# Patient Record
Sex: Female | Born: 1945 | Race: White | Hispanic: No | Marital: Married | Smoking: Never smoker
Health system: Southern US, Community
[De-identification: ages and names within clinical notes are randomized; demographics above are authoritative.]

## PROBLEM LIST (undated history)

## (undated) DIAGNOSIS — I10 Essential (primary) hypertension: Secondary | ICD-10-CM

## (undated) DIAGNOSIS — T7840XA Allergy, unspecified, initial encounter: Secondary | ICD-10-CM

## (undated) DIAGNOSIS — E785 Hyperlipidemia, unspecified: Secondary | ICD-10-CM

## (undated) DIAGNOSIS — M353 Polymyalgia rheumatica: Secondary | ICD-10-CM

## (undated) DIAGNOSIS — J302 Other seasonal allergic rhinitis: Secondary | ICD-10-CM

## (undated) DIAGNOSIS — R42 Dizziness and giddiness: Secondary | ICD-10-CM

## (undated) DIAGNOSIS — M1990 Unspecified osteoarthritis, unspecified site: Secondary | ICD-10-CM

## (undated) DIAGNOSIS — K219 Gastro-esophageal reflux disease without esophagitis: Secondary | ICD-10-CM

## (undated) DIAGNOSIS — M7060 Trochanteric bursitis, unspecified hip: Secondary | ICD-10-CM

## (undated) DIAGNOSIS — E559 Vitamin D deficiency, unspecified: Secondary | ICD-10-CM

## (undated) HISTORY — PX: JOINT REPLACEMENT: SHX530

## (undated) HISTORY — DX: Trochanteric bursitis, unspecified hip: M70.60

## (undated) HISTORY — DX: Other seasonal allergic rhinitis: J30.2

## (undated) HISTORY — DX: Hyperlipidemia, unspecified: E78.5

## (undated) HISTORY — DX: Dizziness and giddiness: R42

## (undated) HISTORY — DX: Polymyalgia rheumatica: M35.3

## (undated) HISTORY — PX: CATARACT EXTRACTION: SUR2

## (undated) HISTORY — PX: EYE SURGERY: SHX253

## (undated) HISTORY — DX: Gastro-esophageal reflux disease without esophagitis: K21.9

## (undated) HISTORY — DX: Unspecified osteoarthritis, unspecified site: M19.90

## (undated) HISTORY — PX: REPLACEMENT TOTAL KNEE BILATERAL: SUR1225

## (undated) HISTORY — DX: Allergy, unspecified, initial encounter: T78.40XA

## (undated) HISTORY — DX: Essential (primary) hypertension: I10

## (undated) HISTORY — DX: Vitamin D deficiency, unspecified: E55.9

---

## 2008-05-05 HISTORY — PX: ESOPHAGOGASTRODUODENOSCOPY: SHX1529

## 2008-05-06 LAB — HM COLONOSCOPY: HM Colonoscopy: NORMAL

## 2010-05-06 LAB — HM DEXA SCAN

## 2011-06-04 DIAGNOSIS — M76899 Other specified enthesopathies of unspecified lower limb, excluding foot: Secondary | ICD-10-CM | POA: Diagnosis not present

## 2011-06-09 DIAGNOSIS — M545 Low back pain: Secondary | ICD-10-CM | POA: Diagnosis not present

## 2011-06-09 DIAGNOSIS — M5137 Other intervertebral disc degeneration, lumbosacral region: Secondary | ICD-10-CM | POA: Diagnosis not present

## 2011-06-09 DIAGNOSIS — F40298 Other specified phobia: Secondary | ICD-10-CM | POA: Diagnosis not present

## 2011-06-09 DIAGNOSIS — K219 Gastro-esophageal reflux disease without esophagitis: Secondary | ICD-10-CM | POA: Diagnosis not present

## 2011-06-09 DIAGNOSIS — M1990 Unspecified osteoarthritis, unspecified site: Secondary | ICD-10-CM | POA: Diagnosis not present

## 2011-06-09 DIAGNOSIS — M48061 Spinal stenosis, lumbar region without neurogenic claudication: Secondary | ICD-10-CM | POA: Diagnosis not present

## 2011-06-09 DIAGNOSIS — Z96659 Presence of unspecified artificial knee joint: Secondary | ICD-10-CM | POA: Diagnosis not present

## 2011-06-23 DIAGNOSIS — M5137 Other intervertebral disc degeneration, lumbosacral region: Secondary | ICD-10-CM | POA: Diagnosis not present

## 2011-06-23 DIAGNOSIS — M48061 Spinal stenosis, lumbar region without neurogenic claudication: Secondary | ICD-10-CM | POA: Diagnosis not present

## 2011-07-02 DIAGNOSIS — K219 Gastro-esophageal reflux disease without esophagitis: Secondary | ICD-10-CM | POA: Diagnosis not present

## 2011-07-02 DIAGNOSIS — Z79899 Other long term (current) drug therapy: Secondary | ICD-10-CM | POA: Diagnosis not present

## 2011-07-02 DIAGNOSIS — Z0000 Encounter for general adult medical examination without abnormal findings: Secondary | ICD-10-CM | POA: Diagnosis not present

## 2011-07-02 DIAGNOSIS — E785 Hyperlipidemia, unspecified: Secondary | ICD-10-CM | POA: Diagnosis not present

## 2011-07-02 DIAGNOSIS — E559 Vitamin D deficiency, unspecified: Secondary | ICD-10-CM | POA: Diagnosis not present

## 2011-07-02 DIAGNOSIS — I10 Essential (primary) hypertension: Secondary | ICD-10-CM | POA: Diagnosis not present

## 2011-07-10 DIAGNOSIS — D2330 Other benign neoplasm of skin of unspecified part of face: Secondary | ICD-10-CM | POA: Diagnosis not present

## 2011-07-10 DIAGNOSIS — L570 Actinic keratosis: Secondary | ICD-10-CM | POA: Diagnosis not present

## 2011-07-10 DIAGNOSIS — D235 Other benign neoplasm of skin of trunk: Secondary | ICD-10-CM | POA: Diagnosis not present

## 2011-07-10 DIAGNOSIS — C44319 Basal cell carcinoma of skin of other parts of face: Secondary | ICD-10-CM | POA: Diagnosis not present

## 2011-07-17 DIAGNOSIS — H353190 Nonexudative age-related macular degeneration, unspecified eye, stage unspecified: Secondary | ICD-10-CM | POA: Diagnosis not present

## 2011-07-17 DIAGNOSIS — H259 Unspecified age-related cataract: Secondary | ICD-10-CM | POA: Diagnosis not present

## 2011-08-11 DIAGNOSIS — M76899 Other specified enthesopathies of unspecified lower limb, excluding foot: Secondary | ICD-10-CM | POA: Diagnosis not present

## 2011-08-11 DIAGNOSIS — M48061 Spinal stenosis, lumbar region without neurogenic claudication: Secondary | ICD-10-CM | POA: Diagnosis not present

## 2011-08-11 DIAGNOSIS — M5137 Other intervertebral disc degeneration, lumbosacral region: Secondary | ICD-10-CM | POA: Diagnosis not present

## 2011-08-22 DIAGNOSIS — Z1231 Encounter for screening mammogram for malignant neoplasm of breast: Secondary | ICD-10-CM | POA: Diagnosis not present

## 2011-10-03 DIAGNOSIS — M76899 Other specified enthesopathies of unspecified lower limb, excluding foot: Secondary | ICD-10-CM | POA: Diagnosis not present

## 2011-12-19 DIAGNOSIS — M76899 Other specified enthesopathies of unspecified lower limb, excluding foot: Secondary | ICD-10-CM | POA: Diagnosis not present

## 2012-01-19 DIAGNOSIS — H259 Unspecified age-related cataract: Secondary | ICD-10-CM | POA: Diagnosis not present

## 2012-01-23 DIAGNOSIS — M76899 Other specified enthesopathies of unspecified lower limb, excluding foot: Secondary | ICD-10-CM | POA: Diagnosis not present

## 2012-02-20 DIAGNOSIS — M25519 Pain in unspecified shoulder: Secondary | ICD-10-CM | POA: Diagnosis not present

## 2012-02-20 DIAGNOSIS — M25569 Pain in unspecified knee: Secondary | ICD-10-CM | POA: Diagnosis not present

## 2012-03-01 DIAGNOSIS — Z23 Encounter for immunization: Secondary | ICD-10-CM | POA: Diagnosis not present

## 2012-04-05 DIAGNOSIS — Z01810 Encounter for preprocedural cardiovascular examination: Secondary | ICD-10-CM | POA: Diagnosis not present

## 2012-04-05 DIAGNOSIS — M25569 Pain in unspecified knee: Secondary | ICD-10-CM | POA: Diagnosis not present

## 2012-04-05 DIAGNOSIS — M76899 Other specified enthesopathies of unspecified lower limb, excluding foot: Secondary | ICD-10-CM | POA: Diagnosis not present

## 2012-04-05 DIAGNOSIS — Z01818 Encounter for other preprocedural examination: Secondary | ICD-10-CM | POA: Diagnosis not present

## 2012-04-05 DIAGNOSIS — M25519 Pain in unspecified shoulder: Secondary | ICD-10-CM | POA: Diagnosis not present

## 2012-04-07 DIAGNOSIS — N390 Urinary tract infection, site not specified: Secondary | ICD-10-CM | POA: Diagnosis not present

## 2012-04-13 DIAGNOSIS — G8918 Other acute postprocedural pain: Secondary | ICD-10-CM | POA: Diagnosis not present

## 2012-04-13 DIAGNOSIS — Z87440 Personal history of urinary (tract) infections: Secondary | ICD-10-CM | POA: Diagnosis not present

## 2012-04-13 DIAGNOSIS — Z880 Allergy status to penicillin: Secondary | ICD-10-CM | POA: Diagnosis not present

## 2012-04-13 DIAGNOSIS — Z96659 Presence of unspecified artificial knee joint: Secondary | ICD-10-CM | POA: Diagnosis not present

## 2012-04-13 DIAGNOSIS — T84059A Periprosthetic osteolysis of unspecified internal prosthetic joint, initial encounter: Secondary | ICD-10-CM | POA: Diagnosis present

## 2012-04-13 DIAGNOSIS — F40298 Other specified phobia: Secondary | ICD-10-CM | POA: Diagnosis present

## 2012-04-13 DIAGNOSIS — T84099A Other mechanical complication of unspecified internal joint prosthesis, initial encounter: Secondary | ICD-10-CM | POA: Diagnosis not present

## 2012-04-13 DIAGNOSIS — M25569 Pain in unspecified knee: Secondary | ICD-10-CM | POA: Diagnosis not present

## 2012-04-13 DIAGNOSIS — T84069A Wear of articular bearing surface of unspecified internal prosthetic joint, initial encounter: Secondary | ICD-10-CM | POA: Diagnosis present

## 2012-04-13 DIAGNOSIS — I10 Essential (primary) hypertension: Secondary | ICD-10-CM | POA: Diagnosis present

## 2012-04-13 DIAGNOSIS — Z885 Allergy status to narcotic agent status: Secondary | ICD-10-CM | POA: Diagnosis not present

## 2012-04-13 DIAGNOSIS — H269 Unspecified cataract: Secondary | ICD-10-CM | POA: Diagnosis present

## 2012-04-13 DIAGNOSIS — K219 Gastro-esophageal reflux disease without esophagitis: Secondary | ICD-10-CM | POA: Diagnosis present

## 2012-04-13 DIAGNOSIS — Z6834 Body mass index (BMI) 34.0-34.9, adult: Secondary | ICD-10-CM | POA: Diagnosis not present

## 2012-04-13 DIAGNOSIS — Z8261 Family history of arthritis: Secondary | ICD-10-CM | POA: Diagnosis not present

## 2012-04-17 DIAGNOSIS — R269 Unspecified abnormalities of gait and mobility: Secondary | ICD-10-CM | POA: Diagnosis not present

## 2012-04-17 DIAGNOSIS — IMO0001 Reserved for inherently not codable concepts without codable children: Secondary | ICD-10-CM | POA: Diagnosis not present

## 2012-04-17 DIAGNOSIS — Z7901 Long term (current) use of anticoagulants: Secondary | ICD-10-CM | POA: Diagnosis not present

## 2012-04-17 DIAGNOSIS — Z471 Aftercare following joint replacement surgery: Secondary | ICD-10-CM | POA: Diagnosis not present

## 2012-04-17 DIAGNOSIS — Z96659 Presence of unspecified artificial knee joint: Secondary | ICD-10-CM | POA: Diagnosis not present

## 2012-04-19 DIAGNOSIS — IMO0001 Reserved for inherently not codable concepts without codable children: Secondary | ICD-10-CM | POA: Diagnosis not present

## 2012-04-19 DIAGNOSIS — R269 Unspecified abnormalities of gait and mobility: Secondary | ICD-10-CM | POA: Diagnosis not present

## 2012-04-19 DIAGNOSIS — Z7901 Long term (current) use of anticoagulants: Secondary | ICD-10-CM | POA: Diagnosis not present

## 2012-04-19 DIAGNOSIS — Z471 Aftercare following joint replacement surgery: Secondary | ICD-10-CM | POA: Diagnosis not present

## 2012-04-19 DIAGNOSIS — Z96659 Presence of unspecified artificial knee joint: Secondary | ICD-10-CM | POA: Diagnosis not present

## 2012-04-21 DIAGNOSIS — R269 Unspecified abnormalities of gait and mobility: Secondary | ICD-10-CM | POA: Diagnosis not present

## 2012-04-21 DIAGNOSIS — Z471 Aftercare following joint replacement surgery: Secondary | ICD-10-CM | POA: Diagnosis not present

## 2012-04-21 DIAGNOSIS — IMO0001 Reserved for inherently not codable concepts without codable children: Secondary | ICD-10-CM | POA: Diagnosis not present

## 2012-04-21 DIAGNOSIS — Z7901 Long term (current) use of anticoagulants: Secondary | ICD-10-CM | POA: Diagnosis not present

## 2012-04-21 DIAGNOSIS — Z96659 Presence of unspecified artificial knee joint: Secondary | ICD-10-CM | POA: Diagnosis not present

## 2012-04-23 DIAGNOSIS — Z471 Aftercare following joint replacement surgery: Secondary | ICD-10-CM | POA: Diagnosis not present

## 2012-04-23 DIAGNOSIS — Z96659 Presence of unspecified artificial knee joint: Secondary | ICD-10-CM | POA: Diagnosis not present

## 2012-04-23 DIAGNOSIS — Z7901 Long term (current) use of anticoagulants: Secondary | ICD-10-CM | POA: Diagnosis not present

## 2012-04-23 DIAGNOSIS — IMO0001 Reserved for inherently not codable concepts without codable children: Secondary | ICD-10-CM | POA: Diagnosis not present

## 2012-04-23 DIAGNOSIS — R269 Unspecified abnormalities of gait and mobility: Secondary | ICD-10-CM | POA: Diagnosis not present

## 2012-04-26 DIAGNOSIS — Z7901 Long term (current) use of anticoagulants: Secondary | ICD-10-CM | POA: Diagnosis not present

## 2012-04-26 DIAGNOSIS — IMO0001 Reserved for inherently not codable concepts without codable children: Secondary | ICD-10-CM | POA: Diagnosis not present

## 2012-04-26 DIAGNOSIS — R269 Unspecified abnormalities of gait and mobility: Secondary | ICD-10-CM | POA: Diagnosis not present

## 2012-04-26 DIAGNOSIS — Z96659 Presence of unspecified artificial knee joint: Secondary | ICD-10-CM | POA: Diagnosis not present

## 2012-04-26 DIAGNOSIS — Z471 Aftercare following joint replacement surgery: Secondary | ICD-10-CM | POA: Diagnosis not present

## 2012-04-29 DIAGNOSIS — Z96659 Presence of unspecified artificial knee joint: Secondary | ICD-10-CM | POA: Diagnosis not present

## 2012-04-29 DIAGNOSIS — IMO0001 Reserved for inherently not codable concepts without codable children: Secondary | ICD-10-CM | POA: Diagnosis not present

## 2012-04-29 DIAGNOSIS — Z7901 Long term (current) use of anticoagulants: Secondary | ICD-10-CM | POA: Diagnosis not present

## 2012-04-29 DIAGNOSIS — R269 Unspecified abnormalities of gait and mobility: Secondary | ICD-10-CM | POA: Diagnosis not present

## 2012-04-29 DIAGNOSIS — Z471 Aftercare following joint replacement surgery: Secondary | ICD-10-CM | POA: Diagnosis not present

## 2012-04-30 DIAGNOSIS — Z96659 Presence of unspecified artificial knee joint: Secondary | ICD-10-CM | POA: Diagnosis not present

## 2012-04-30 DIAGNOSIS — R269 Unspecified abnormalities of gait and mobility: Secondary | ICD-10-CM | POA: Diagnosis not present

## 2012-04-30 DIAGNOSIS — Z471 Aftercare following joint replacement surgery: Secondary | ICD-10-CM | POA: Diagnosis not present

## 2012-04-30 DIAGNOSIS — IMO0001 Reserved for inherently not codable concepts without codable children: Secondary | ICD-10-CM | POA: Diagnosis not present

## 2012-04-30 DIAGNOSIS — Z7901 Long term (current) use of anticoagulants: Secondary | ICD-10-CM | POA: Diagnosis not present

## 2012-05-10 DIAGNOSIS — R609 Edema, unspecified: Secondary | ICD-10-CM | POA: Diagnosis not present

## 2012-05-10 DIAGNOSIS — M6281 Muscle weakness (generalized): Secondary | ICD-10-CM | POA: Diagnosis not present

## 2012-05-10 DIAGNOSIS — M2540 Effusion, unspecified joint: Secondary | ICD-10-CM | POA: Diagnosis not present

## 2012-05-10 DIAGNOSIS — M25569 Pain in unspecified knee: Secondary | ICD-10-CM | POA: Diagnosis not present

## 2012-05-12 DIAGNOSIS — M25569 Pain in unspecified knee: Secondary | ICD-10-CM | POA: Diagnosis not present

## 2012-05-14 DIAGNOSIS — IMO0001 Reserved for inherently not codable concepts without codable children: Secondary | ICD-10-CM | POA: Diagnosis not present

## 2012-05-17 DIAGNOSIS — M25569 Pain in unspecified knee: Secondary | ICD-10-CM | POA: Diagnosis not present

## 2012-05-17 DIAGNOSIS — M2540 Effusion, unspecified joint: Secondary | ICD-10-CM | POA: Diagnosis not present

## 2012-05-17 DIAGNOSIS — R609 Edema, unspecified: Secondary | ICD-10-CM | POA: Diagnosis not present

## 2012-05-17 DIAGNOSIS — M6281 Muscle weakness (generalized): Secondary | ICD-10-CM | POA: Diagnosis not present

## 2012-05-19 DIAGNOSIS — M25569 Pain in unspecified knee: Secondary | ICD-10-CM | POA: Diagnosis not present

## 2012-05-19 DIAGNOSIS — M2540 Effusion, unspecified joint: Secondary | ICD-10-CM | POA: Diagnosis not present

## 2012-05-19 DIAGNOSIS — R609 Edema, unspecified: Secondary | ICD-10-CM | POA: Diagnosis not present

## 2012-05-19 DIAGNOSIS — M6281 Muscle weakness (generalized): Secondary | ICD-10-CM | POA: Diagnosis not present

## 2012-05-24 DIAGNOSIS — M25569 Pain in unspecified knee: Secondary | ICD-10-CM | POA: Diagnosis not present

## 2012-05-24 DIAGNOSIS — M2540 Effusion, unspecified joint: Secondary | ICD-10-CM | POA: Diagnosis not present

## 2012-05-24 DIAGNOSIS — M6281 Muscle weakness (generalized): Secondary | ICD-10-CM | POA: Diagnosis not present

## 2012-05-26 DIAGNOSIS — R609 Edema, unspecified: Secondary | ICD-10-CM | POA: Diagnosis not present

## 2012-05-26 DIAGNOSIS — M2540 Effusion, unspecified joint: Secondary | ICD-10-CM | POA: Diagnosis not present

## 2012-05-26 DIAGNOSIS — M25569 Pain in unspecified knee: Secondary | ICD-10-CM | POA: Diagnosis not present

## 2012-05-31 DIAGNOSIS — R609 Edema, unspecified: Secondary | ICD-10-CM | POA: Diagnosis not present

## 2012-05-31 DIAGNOSIS — M2540 Effusion, unspecified joint: Secondary | ICD-10-CM | POA: Diagnosis not present

## 2012-05-31 DIAGNOSIS — M25569 Pain in unspecified knee: Secondary | ICD-10-CM | POA: Diagnosis not present

## 2012-05-31 DIAGNOSIS — M6281 Muscle weakness (generalized): Secondary | ICD-10-CM | POA: Diagnosis not present

## 2012-06-07 DIAGNOSIS — M6281 Muscle weakness (generalized): Secondary | ICD-10-CM | POA: Diagnosis not present

## 2012-06-07 DIAGNOSIS — M25569 Pain in unspecified knee: Secondary | ICD-10-CM | POA: Diagnosis not present

## 2012-06-07 DIAGNOSIS — M2540 Effusion, unspecified joint: Secondary | ICD-10-CM | POA: Diagnosis not present

## 2012-06-07 DIAGNOSIS — R609 Edema, unspecified: Secondary | ICD-10-CM | POA: Diagnosis not present

## 2012-06-09 DIAGNOSIS — M6281 Muscle weakness (generalized): Secondary | ICD-10-CM | POA: Diagnosis not present

## 2012-06-09 DIAGNOSIS — R609 Edema, unspecified: Secondary | ICD-10-CM | POA: Diagnosis not present

## 2012-06-09 DIAGNOSIS — M25569 Pain in unspecified knee: Secondary | ICD-10-CM | POA: Diagnosis not present

## 2012-06-09 DIAGNOSIS — M2540 Effusion, unspecified joint: Secondary | ICD-10-CM | POA: Diagnosis not present

## 2012-06-11 DIAGNOSIS — M1710 Unilateral primary osteoarthritis, unspecified knee: Secondary | ICD-10-CM | POA: Diagnosis not present

## 2012-06-11 DIAGNOSIS — M76899 Other specified enthesopathies of unspecified lower limb, excluding foot: Secondary | ICD-10-CM | POA: Diagnosis not present

## 2012-06-14 DIAGNOSIS — R609 Edema, unspecified: Secondary | ICD-10-CM | POA: Diagnosis not present

## 2012-06-14 DIAGNOSIS — M6281 Muscle weakness (generalized): Secondary | ICD-10-CM | POA: Diagnosis not present

## 2012-06-14 DIAGNOSIS — M25569 Pain in unspecified knee: Secondary | ICD-10-CM | POA: Diagnosis not present

## 2012-06-14 DIAGNOSIS — M2540 Effusion, unspecified joint: Secondary | ICD-10-CM | POA: Diagnosis not present

## 2012-06-16 DIAGNOSIS — M6281 Muscle weakness (generalized): Secondary | ICD-10-CM | POA: Diagnosis not present

## 2012-06-16 DIAGNOSIS — M2540 Effusion, unspecified joint: Secondary | ICD-10-CM | POA: Diagnosis not present

## 2012-06-16 DIAGNOSIS — M25569 Pain in unspecified knee: Secondary | ICD-10-CM | POA: Diagnosis not present

## 2012-06-21 DIAGNOSIS — M25569 Pain in unspecified knee: Secondary | ICD-10-CM | POA: Diagnosis not present

## 2012-06-21 DIAGNOSIS — R609 Edema, unspecified: Secondary | ICD-10-CM | POA: Diagnosis not present

## 2012-06-21 DIAGNOSIS — M2540 Effusion, unspecified joint: Secondary | ICD-10-CM | POA: Diagnosis not present

## 2012-06-21 DIAGNOSIS — M6281 Muscle weakness (generalized): Secondary | ICD-10-CM | POA: Diagnosis not present

## 2012-06-23 DIAGNOSIS — M25569 Pain in unspecified knee: Secondary | ICD-10-CM | POA: Diagnosis not present

## 2012-06-30 DIAGNOSIS — M6281 Muscle weakness (generalized): Secondary | ICD-10-CM | POA: Diagnosis not present

## 2012-06-30 DIAGNOSIS — M25569 Pain in unspecified knee: Secondary | ICD-10-CM | POA: Diagnosis not present

## 2012-06-30 DIAGNOSIS — R609 Edema, unspecified: Secondary | ICD-10-CM | POA: Diagnosis not present

## 2012-06-30 DIAGNOSIS — M2540 Effusion, unspecified joint: Secondary | ICD-10-CM | POA: Diagnosis not present

## 2012-07-30 DIAGNOSIS — M1710 Unilateral primary osteoarthritis, unspecified knee: Secondary | ICD-10-CM | POA: Diagnosis not present

## 2012-09-03 DIAGNOSIS — M1710 Unilateral primary osteoarthritis, unspecified knee: Secondary | ICD-10-CM | POA: Diagnosis not present

## 2012-09-03 DIAGNOSIS — M1610 Unilateral primary osteoarthritis, unspecified hip: Secondary | ICD-10-CM | POA: Diagnosis not present

## 2012-09-28 DIAGNOSIS — M064 Inflammatory polyarthropathy: Secondary | ICD-10-CM | POA: Diagnosis not present

## 2012-12-10 DIAGNOSIS — L570 Actinic keratosis: Secondary | ICD-10-CM | POA: Diagnosis not present

## 2012-12-10 DIAGNOSIS — D235 Other benign neoplasm of skin of trunk: Secondary | ICD-10-CM | POA: Diagnosis not present

## 2012-12-10 DIAGNOSIS — D485 Neoplasm of uncertain behavior of skin: Secondary | ICD-10-CM | POA: Diagnosis not present

## 2012-12-10 DIAGNOSIS — L820 Inflamed seborrheic keratosis: Secondary | ICD-10-CM | POA: Diagnosis not present

## 2012-12-10 DIAGNOSIS — D0470 Carcinoma in situ of skin of unspecified lower limb, including hip: Secondary | ICD-10-CM | POA: Diagnosis not present

## 2012-12-13 DIAGNOSIS — M76899 Other specified enthesopathies of unspecified lower limb, excluding foot: Secondary | ICD-10-CM | POA: Diagnosis not present

## 2012-12-15 DIAGNOSIS — H259 Unspecified age-related cataract: Secondary | ICD-10-CM | POA: Diagnosis not present

## 2012-12-21 DIAGNOSIS — M899 Disorder of bone, unspecified: Secondary | ICD-10-CM | POA: Diagnosis not present

## 2012-12-21 DIAGNOSIS — I10 Essential (primary) hypertension: Secondary | ICD-10-CM | POA: Diagnosis not present

## 2012-12-21 DIAGNOSIS — K219 Gastro-esophageal reflux disease without esophagitis: Secondary | ICD-10-CM | POA: Diagnosis not present

## 2012-12-21 DIAGNOSIS — Z0000 Encounter for general adult medical examination without abnormal findings: Secondary | ICD-10-CM | POA: Diagnosis not present

## 2012-12-23 DIAGNOSIS — D0470 Carcinoma in situ of skin of unspecified lower limb, including hip: Secondary | ICD-10-CM | POA: Diagnosis not present

## 2013-01-14 DIAGNOSIS — Z1231 Encounter for screening mammogram for malignant neoplasm of breast: Secondary | ICD-10-CM | POA: Diagnosis not present

## 2013-02-04 DIAGNOSIS — M353 Polymyalgia rheumatica: Secondary | ICD-10-CM | POA: Diagnosis not present

## 2013-02-04 DIAGNOSIS — M064 Inflammatory polyarthropathy: Secondary | ICD-10-CM | POA: Diagnosis not present

## 2013-02-09 DIAGNOSIS — M76899 Other specified enthesopathies of unspecified lower limb, excluding foot: Secondary | ICD-10-CM | POA: Diagnosis not present

## 2013-02-11 DIAGNOSIS — R0982 Postnasal drip: Secondary | ICD-10-CM | POA: Diagnosis not present

## 2013-02-11 DIAGNOSIS — J343 Hypertrophy of nasal turbinates: Secondary | ICD-10-CM | POA: Diagnosis not present

## 2013-02-11 DIAGNOSIS — J309 Allergic rhinitis, unspecified: Secondary | ICD-10-CM | POA: Diagnosis not present

## 2013-02-11 DIAGNOSIS — J329 Chronic sinusitis, unspecified: Secondary | ICD-10-CM | POA: Diagnosis not present

## 2013-02-11 DIAGNOSIS — R51 Headache: Secondary | ICD-10-CM | POA: Diagnosis not present

## 2013-02-25 DIAGNOSIS — J309 Allergic rhinitis, unspecified: Secondary | ICD-10-CM | POA: Diagnosis not present

## 2013-03-02 DIAGNOSIS — R42 Dizziness and giddiness: Secondary | ICD-10-CM | POA: Diagnosis not present

## 2013-03-02 DIAGNOSIS — Z23 Encounter for immunization: Secondary | ICD-10-CM | POA: Diagnosis not present

## 2013-03-02 DIAGNOSIS — R110 Nausea: Secondary | ICD-10-CM | POA: Diagnosis not present

## 2013-03-14 DIAGNOSIS — M76899 Other specified enthesopathies of unspecified lower limb, excluding foot: Secondary | ICD-10-CM | POA: Diagnosis not present

## 2013-04-15 DIAGNOSIS — M76899 Other specified enthesopathies of unspecified lower limb, excluding foot: Secondary | ICD-10-CM | POA: Diagnosis not present

## 2013-04-25 DIAGNOSIS — J40 Bronchitis, not specified as acute or chronic: Secondary | ICD-10-CM | POA: Diagnosis not present

## 2013-05-13 DIAGNOSIS — M353 Polymyalgia rheumatica: Secondary | ICD-10-CM | POA: Diagnosis not present

## 2013-05-13 DIAGNOSIS — E559 Vitamin D deficiency, unspecified: Secondary | ICD-10-CM | POA: Diagnosis not present

## 2013-05-16 ENCOUNTER — Ambulatory Visit: Payer: Self-pay | Admitting: Internal Medicine

## 2013-05-16 DIAGNOSIS — R059 Cough, unspecified: Secondary | ICD-10-CM | POA: Diagnosis not present

## 2013-05-16 DIAGNOSIS — R05 Cough: Secondary | ICD-10-CM | POA: Diagnosis not present

## 2013-05-16 DIAGNOSIS — J40 Bronchitis, not specified as acute or chronic: Secondary | ICD-10-CM | POA: Diagnosis not present

## 2013-06-03 DIAGNOSIS — IMO0002 Reserved for concepts with insufficient information to code with codable children: Secondary | ICD-10-CM | POA: Diagnosis not present

## 2013-06-10 DIAGNOSIS — M5137 Other intervertebral disc degeneration, lumbosacral region: Secondary | ICD-10-CM | POA: Diagnosis not present

## 2013-06-10 DIAGNOSIS — IMO0002 Reserved for concepts with insufficient information to code with codable children: Secondary | ICD-10-CM | POA: Diagnosis not present

## 2013-07-21 DIAGNOSIS — M545 Low back pain, unspecified: Secondary | ICD-10-CM | POA: Diagnosis not present

## 2013-07-21 DIAGNOSIS — M48061 Spinal stenosis, lumbar region without neurogenic claudication: Secondary | ICD-10-CM | POA: Diagnosis not present

## 2013-07-21 DIAGNOSIS — IMO0002 Reserved for concepts with insufficient information to code with codable children: Secondary | ICD-10-CM | POA: Diagnosis not present

## 2013-07-21 DIAGNOSIS — M5137 Other intervertebral disc degeneration, lumbosacral region: Secondary | ICD-10-CM | POA: Diagnosis not present

## 2013-08-03 DIAGNOSIS — M48061 Spinal stenosis, lumbar region without neurogenic claudication: Secondary | ICD-10-CM | POA: Diagnosis not present

## 2013-09-02 DIAGNOSIS — M064 Inflammatory polyarthropathy: Secondary | ICD-10-CM | POA: Diagnosis not present

## 2013-09-07 DIAGNOSIS — M76899 Other specified enthesopathies of unspecified lower limb, excluding foot: Secondary | ICD-10-CM | POA: Diagnosis not present

## 2013-11-25 DIAGNOSIS — M76899 Other specified enthesopathies of unspecified lower limb, excluding foot: Secondary | ICD-10-CM | POA: Diagnosis not present

## 2014-01-06 DIAGNOSIS — M353 Polymyalgia rheumatica: Secondary | ICD-10-CM | POA: Diagnosis not present

## 2014-01-11 DIAGNOSIS — M76899 Other specified enthesopathies of unspecified lower limb, excluding foot: Secondary | ICD-10-CM | POA: Diagnosis not present

## 2014-01-23 DIAGNOSIS — H259 Unspecified age-related cataract: Secondary | ICD-10-CM | POA: Diagnosis not present

## 2014-02-08 DIAGNOSIS — M545 Low back pain: Secondary | ICD-10-CM | POA: Diagnosis not present

## 2014-02-08 DIAGNOSIS — M4806 Spinal stenosis, lumbar region: Secondary | ICD-10-CM | POA: Diagnosis not present

## 2014-02-08 DIAGNOSIS — M5136 Other intervertebral disc degeneration, lumbar region: Secondary | ICD-10-CM | POA: Diagnosis not present

## 2014-02-09 DIAGNOSIS — D235 Other benign neoplasm of skin of trunk: Secondary | ICD-10-CM | POA: Diagnosis not present

## 2014-02-09 DIAGNOSIS — L820 Inflamed seborrheic keratosis: Secondary | ICD-10-CM | POA: Diagnosis not present

## 2014-02-09 DIAGNOSIS — L570 Actinic keratosis: Secondary | ICD-10-CM | POA: Diagnosis not present

## 2014-02-17 DIAGNOSIS — M7062 Trochanteric bursitis, left hip: Secondary | ICD-10-CM | POA: Diagnosis not present

## 2014-02-17 DIAGNOSIS — M7061 Trochanteric bursitis, right hip: Secondary | ICD-10-CM | POA: Diagnosis not present

## 2014-02-20 DIAGNOSIS — Z0000 Encounter for general adult medical examination without abnormal findings: Secondary | ICD-10-CM | POA: Diagnosis not present

## 2014-02-20 DIAGNOSIS — E784 Other hyperlipidemia: Secondary | ICD-10-CM | POA: Diagnosis not present

## 2014-02-20 DIAGNOSIS — I10 Essential (primary) hypertension: Secondary | ICD-10-CM | POA: Diagnosis not present

## 2014-02-20 DIAGNOSIS — K210 Gastro-esophageal reflux disease with esophagitis: Secondary | ICD-10-CM | POA: Diagnosis not present

## 2014-02-20 LAB — BASIC METABOLIC PANEL
BUN: 13 mg/dL (ref 4–21)
CREATININE: 0.9 mg/dL (ref 0.5–1.1)

## 2014-02-20 LAB — LIPID PANEL
CHOLESTEROL: 356 mg/dL — AB (ref 0–200)
HDL: 89 mg/dL — AB (ref 35–70)
LDL Cholesterol: 220 mg/dL
TRIGLYCERIDES: 233 mg/dL — AB (ref 40–160)

## 2014-02-20 LAB — TSH: TSH: 1.7 u[IU]/mL (ref 0.41–5.90)

## 2014-02-20 LAB — CBC AND DIFFERENTIAL: HEMOGLOBIN: 14.5 g/dL (ref 12.0–16.0)

## 2014-03-27 DIAGNOSIS — M7062 Trochanteric bursitis, left hip: Secondary | ICD-10-CM | POA: Diagnosis not present

## 2014-03-27 DIAGNOSIS — M7061 Trochanteric bursitis, right hip: Secondary | ICD-10-CM | POA: Diagnosis not present

## 2014-05-10 DIAGNOSIS — M7062 Trochanteric bursitis, left hip: Secondary | ICD-10-CM | POA: Diagnosis not present

## 2014-05-10 DIAGNOSIS — M7061 Trochanteric bursitis, right hip: Secondary | ICD-10-CM | POA: Diagnosis not present

## 2014-06-23 DIAGNOSIS — M7061 Trochanteric bursitis, right hip: Secondary | ICD-10-CM | POA: Diagnosis not present

## 2014-06-23 DIAGNOSIS — M7062 Trochanteric bursitis, left hip: Secondary | ICD-10-CM | POA: Diagnosis not present

## 2014-06-30 DIAGNOSIS — E2839 Other primary ovarian failure: Secondary | ICD-10-CM | POA: Diagnosis not present

## 2014-06-30 DIAGNOSIS — Z1231 Encounter for screening mammogram for malignant neoplasm of breast: Secondary | ICD-10-CM | POA: Diagnosis not present

## 2014-07-04 LAB — HM MAMMOGRAPHY

## 2014-07-06 DIAGNOSIS — J301 Allergic rhinitis due to pollen: Secondary | ICD-10-CM | POA: Diagnosis not present

## 2014-07-06 DIAGNOSIS — H6121 Impacted cerumen, right ear: Secondary | ICD-10-CM | POA: Diagnosis not present

## 2014-07-06 DIAGNOSIS — J343 Hypertrophy of nasal turbinates: Secondary | ICD-10-CM | POA: Diagnosis not present

## 2014-07-06 DIAGNOSIS — H8111 Benign paroxysmal vertigo, right ear: Secondary | ICD-10-CM | POA: Diagnosis not present

## 2014-07-07 DIAGNOSIS — E559 Vitamin D deficiency, unspecified: Secondary | ICD-10-CM | POA: Diagnosis not present

## 2014-07-07 DIAGNOSIS — M353 Polymyalgia rheumatica: Secondary | ICD-10-CM | POA: Diagnosis not present

## 2014-08-04 DIAGNOSIS — M7062 Trochanteric bursitis, left hip: Secondary | ICD-10-CM | POA: Diagnosis not present

## 2014-08-04 DIAGNOSIS — M7061 Trochanteric bursitis, right hip: Secondary | ICD-10-CM | POA: Diagnosis not present

## 2014-08-11 DIAGNOSIS — D235 Other benign neoplasm of skin of trunk: Secondary | ICD-10-CM | POA: Diagnosis not present

## 2014-08-11 DIAGNOSIS — L820 Inflamed seborrheic keratosis: Secondary | ICD-10-CM | POA: Diagnosis not present

## 2014-08-11 DIAGNOSIS — D234 Other benign neoplasm of skin of scalp and neck: Secondary | ICD-10-CM | POA: Diagnosis not present

## 2014-08-11 DIAGNOSIS — L570 Actinic keratosis: Secondary | ICD-10-CM | POA: Diagnosis not present

## 2014-09-22 DIAGNOSIS — M7061 Trochanteric bursitis, right hip: Secondary | ICD-10-CM | POA: Diagnosis not present

## 2014-09-22 DIAGNOSIS — M7062 Trochanteric bursitis, left hip: Secondary | ICD-10-CM | POA: Diagnosis not present

## 2014-10-24 ENCOUNTER — Encounter: Payer: Self-pay | Admitting: *Deleted

## 2014-11-10 DIAGNOSIS — M7061 Trochanteric bursitis, right hip: Secondary | ICD-10-CM | POA: Diagnosis not present

## 2014-11-10 DIAGNOSIS — M7062 Trochanteric bursitis, left hip: Secondary | ICD-10-CM | POA: Diagnosis not present

## 2014-12-22 DIAGNOSIS — M7062 Trochanteric bursitis, left hip: Secondary | ICD-10-CM | POA: Diagnosis not present

## 2014-12-22 DIAGNOSIS — M7061 Trochanteric bursitis, right hip: Secondary | ICD-10-CM | POA: Diagnosis not present

## 2015-01-12 DIAGNOSIS — M064 Inflammatory polyarthropathy: Secondary | ICD-10-CM | POA: Diagnosis not present

## 2015-01-22 ENCOUNTER — Encounter: Payer: Self-pay | Admitting: Internal Medicine

## 2015-01-22 ENCOUNTER — Other Ambulatory Visit: Payer: Self-pay

## 2015-01-22 ENCOUNTER — Ambulatory Visit (INDEPENDENT_AMBULATORY_CARE_PROVIDER_SITE_OTHER): Payer: Medicare Other | Admitting: Internal Medicine

## 2015-01-22 VITALS — BP 124/64 | HR 105 | Temp 98.6°F | Ht 62.0 in | Wt 212.8 lb

## 2015-01-22 DIAGNOSIS — E559 Vitamin D deficiency, unspecified: Secondary | ICD-10-CM | POA: Insufficient documentation

## 2015-01-22 DIAGNOSIS — N12 Tubulo-interstitial nephritis, not specified as acute or chronic: Secondary | ICD-10-CM | POA: Diagnosis not present

## 2015-01-22 DIAGNOSIS — D229 Melanocytic nevi, unspecified: Secondary | ICD-10-CM | POA: Insufficient documentation

## 2015-01-22 DIAGNOSIS — E785 Hyperlipidemia, unspecified: Secondary | ICD-10-CM | POA: Insufficient documentation

## 2015-01-22 DIAGNOSIS — J302 Other seasonal allergic rhinitis: Secondary | ICD-10-CM | POA: Insufficient documentation

## 2015-01-22 DIAGNOSIS — K2100 Gastro-esophageal reflux disease with esophagitis, without bleeding: Secondary | ICD-10-CM | POA: Insufficient documentation

## 2015-01-22 DIAGNOSIS — E782 Mixed hyperlipidemia: Secondary | ICD-10-CM | POA: Insufficient documentation

## 2015-01-22 DIAGNOSIS — I10 Essential (primary) hypertension: Secondary | ICD-10-CM | POA: Insufficient documentation

## 2015-01-22 MED ORDER — ONDANSETRON 4 MG PO TBDP: 4.0000 mg | ORAL_TABLET | Freq: Three times a day (TID) | ORAL | Status: DC | PRN

## 2015-01-22 MED ORDER — CIPROFLOXACIN HCL 250 MG PO TABS: 250.0000 mg | ORAL_TABLET | Freq: Two times a day (BID) | ORAL | Status: DC

## 2015-01-22 NOTE — Progress Notes (Signed)
Date:  01/22/2015   Name:  Barbara West   DOB:  1946/03/26   MRN:  [id number]   Chief Complaint: URI  patient was feeling sick for the past 5 days. Started with a headache and nausea. She's had several days of vomiting without diarrhea. The headache is responded to ibuprofen or Tylenol. She describes low backache as well as urinary frequency and dysuria. Her urine does not look cloudy or bloody. She denies ear pain sore throat sinus pressure. She believes she may have had low-grade fever the first couple days but none since. She's been too weak to come in but today just felt that she wasn't improving.   Review of Systems:  Review of Systems  Constitutional: Positive for fever, diaphoresis and fatigue. Negative for chills.  HENT: Negative for hearing loss, sinus pressure, sore throat and tinnitus.   Respiratory: Negative for cough, chest tightness and shortness of breath.   Cardiovascular: Negative for chest pain, palpitations and leg swelling.  Gastrointestinal: Positive for abdominal pain.  Genitourinary: Positive for dysuria, urgency and frequency. Negative for hematuria and pelvic pain.  Musculoskeletal: Negative for back pain and gait problem.  Neurological: Positive for light-headedness and headaches. Negative for tremors and syncope.  Hematological: Negative for adenopathy.    Patient Active Problem List   Diagnosis Date Noted  . Avitaminosis D 01/22/2015  . Allergic rhinitis, seasonal 01/22/2015  . Hyperlipidemia 01/22/2015  . 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 Essential (primary) hypertension 01/22/2015  . Gastroesophageal reflux disease with esophagitis 01/22/2015    Prior to Admission medications   Medication Sig Start Date End Date Taking? Authorizing Remona Boom  Azelastine-Fluticasone (DYMISTA) 137-50 MCG/ACT SUSP Place into the nose.   Yes Historical Janoah Menna, MD  fexofenadine (ALLEGRA) 180 MG tablet Take by mouth.   Yes Historical Laurabelle Gorczyca, MD  gabapentin  (NEURONTIN) 100 MG capsule Take by mouth.   Yes Historical Damya Comley, MD  hydrochlorothiazide (HYDRODIURIL) 12.5 MG tablet Take by mouth. 02/20/14  Yes Historical Lejon Afzal, MD  Ibuprofen 200 MG CAPS Take by mouth.   Yes Historical Brooklyn Alfredo, MD  lansoprazole (PREVACID) 30 MG capsule Take by mouth. 02/20/14  Yes Historical Lebron Nauert, MD  olopatadine (PATANOL) 0.1 % ophthalmic solution Apply to eye.   Yes Historical Tyree Vandruff, MD  predniSONE (DELTASONE) 5 MG tablet Take by mouth.   Yes Historical Kihanna Kamiya, MD  MULTIPLE VITAMINS-MINERALS ER PO Take by mouth.    Historical Delois Silvester, MD    Allergies  Allergen Reactions  . Celecoxib Anaphylaxis  . Atorvastatin   . Clarithromycin Other (See Comments)  . Losartan Potassium Other (See Comments)  . Sulfa Antibiotics Nausea And Vomiting  . Penicillins Rash    Past Surgical History  Procedure Laterality Date  . Replacement total knee bilateral      Social History  Substance Use Topics  . Smoking status: Never Smoker   . Smokeless tobacco: None  . Alcohol Use: 1.2 oz/week    2 Standard drinks or equivalent per week     Medication list has been reviewed and updated.  Physical Examination:  Physical Exam  Constitutional: She appears well-developed. She has a sickly appearance. No distress.  HENT:  Right Ear: Tympanic membrane normal.  Left Ear: Tympanic membrane normal.  Nose: Nose normal. Right sinus exhibits no maxillary sinus tenderness and no frontal sinus tenderness. Left sinus exhibits no maxillary sinus tenderness and no frontal sinus tenderness.  Mouth/Throat: Uvula is midline and oropharynx is clear and moist. Mucous membranes are not dry.  Neck: Normal range of motion. Neck supple. No muscular tenderness present.  Cardiovascular: Normal rate, regular rhythm and S1 normal.   Pulmonary/Chest: Breath sounds normal.  Abdominal: Soft. She exhibits no distension. There is tenderness in the suprapubic area. There is CVA tenderness.   Lymphadenopathy:    She has no cervical adenopathy.  Neurological: She is alert.  Skin: Skin is warm and dry.  Nursing note and vitals reviewed.   BP 124/64 mmHg  Pulse 105  Temp(Src) 98.6 F (37 C)  Ht 5\' 2"  (1.575 m)  Wt 212 lb 12.8 oz (96.525 kg)  BMI 38.91 kg/m2  SpO2 95%  Assessment and Plan: 1. Pyelonephritis Patient unable to provide a urine specimen so we'll treat empirically Patient encouraged to increase her fluid intake May take Tylenol as needed for headache Call in several days if not improving for further evaluation - ciprofloxacin (CIPRO) 250 MG tablet; Take 1 tablet (250 mg total) by mouth 2 (two) times daily.  Dispense: 20 tablet; Refill: 0 - ondansetron (ZOFRAN-ODT) 4 MG disintegrating tablet; Take 1 tablet (4 mg total) by mouth every 8 (eight) hours as needed for nausea or vomiting.  Dispense: 20 tablet; Refill: 0   Halina Maidens, MD [facility]  01/22/2015

## 2015-02-02 DIAGNOSIS — M7062 Trochanteric bursitis, left hip: Secondary | ICD-10-CM | POA: Diagnosis not present

## 2015-02-02 DIAGNOSIS — M7061 Trochanteric bursitis, right hip: Secondary | ICD-10-CM | POA: Diagnosis not present

## 2015-02-16 DIAGNOSIS — L820 Inflamed seborrheic keratosis: Secondary | ICD-10-CM | POA: Diagnosis not present

## 2015-02-16 DIAGNOSIS — L570 Actinic keratosis: Secondary | ICD-10-CM | POA: Diagnosis not present

## 2015-02-16 DIAGNOSIS — D485 Neoplasm of uncertain behavior of skin: Secondary | ICD-10-CM | POA: Diagnosis not present

## 2015-02-16 DIAGNOSIS — H0260 Xanthelasma of unspecified eye, unspecified eyelid: Secondary | ICD-10-CM | POA: Diagnosis not present

## 2015-02-16 DIAGNOSIS — L738 Other specified follicular disorders: Secondary | ICD-10-CM | POA: Diagnosis not present

## 2015-02-16 DIAGNOSIS — L821 Other seborrheic keratosis: Secondary | ICD-10-CM | POA: Diagnosis not present

## 2015-02-16 DIAGNOSIS — D1801 Hemangioma of skin and subcutaneous tissue: Secondary | ICD-10-CM | POA: Diagnosis not present

## 2015-03-19 DIAGNOSIS — Z23 Encounter for immunization: Secondary | ICD-10-CM | POA: Diagnosis not present

## 2015-03-23 DIAGNOSIS — Z96653 Presence of artificial knee joint, bilateral: Secondary | ICD-10-CM | POA: Diagnosis not present

## 2015-03-23 DIAGNOSIS — M7061 Trochanteric bursitis, right hip: Secondary | ICD-10-CM | POA: Diagnosis not present

## 2015-03-23 DIAGNOSIS — M7062 Trochanteric bursitis, left hip: Secondary | ICD-10-CM | POA: Diagnosis not present

## 2015-04-13 DIAGNOSIS — H52223 Regular astigmatism, bilateral: Secondary | ICD-10-CM | POA: Diagnosis not present

## 2015-04-13 DIAGNOSIS — H524 Presbyopia: Secondary | ICD-10-CM | POA: Diagnosis not present

## 2015-04-13 DIAGNOSIS — H5203 Hypermetropia, bilateral: Secondary | ICD-10-CM | POA: Diagnosis not present

## 2015-04-13 DIAGNOSIS — H25813 Combined forms of age-related cataract, bilateral: Secondary | ICD-10-CM | POA: Diagnosis not present

## 2015-04-14 ENCOUNTER — Encounter: Payer: Self-pay | Admitting: Internal Medicine

## 2015-04-23 ENCOUNTER — Other Ambulatory Visit: Payer: Self-pay | Admitting: Internal Medicine

## 2015-04-23 ENCOUNTER — Ambulatory Visit (INDEPENDENT_AMBULATORY_CARE_PROVIDER_SITE_OTHER): Payer: Medicare Other | Admitting: Internal Medicine

## 2015-04-23 ENCOUNTER — Encounter: Payer: Self-pay | Admitting: Internal Medicine

## 2015-04-23 VITALS — BP 128/80 | HR 76 | Ht 62.0 in | Wt 219.0 lb

## 2015-04-23 DIAGNOSIS — Z0000 Encounter for general adult medical examination without abnormal findings: Secondary | ICD-10-CM | POA: Diagnosis not present

## 2015-04-23 DIAGNOSIS — E785 Hyperlipidemia, unspecified: Secondary | ICD-10-CM

## 2015-04-23 DIAGNOSIS — I10 Essential (primary) hypertension: Secondary | ICD-10-CM

## 2015-04-23 DIAGNOSIS — K2100 Gastro-esophageal reflux disease with esophagitis, without bleeding: Secondary | ICD-10-CM

## 2015-04-23 DIAGNOSIS — D229 Melanocytic nevi, unspecified: Secondary | ICD-10-CM

## 2015-04-23 DIAGNOSIS — E559 Vitamin D deficiency, unspecified: Secondary | ICD-10-CM | POA: Diagnosis not present

## 2015-04-23 DIAGNOSIS — Z23 Encounter for immunization: Secondary | ICD-10-CM | POA: Diagnosis not present

## 2015-04-23 DIAGNOSIS — N952 Postmenopausal atrophic vaginitis: Secondary | ICD-10-CM | POA: Diagnosis not present

## 2015-04-23 DIAGNOSIS — Z1159 Encounter for screening for other viral diseases: Secondary | ICD-10-CM

## 2015-04-23 LAB — POCT URINALYSIS DIPSTICK
BILIRUBIN UA: NEGATIVE
Glucose, UA: NEGATIVE
Ketones, UA: NEGATIVE
Leukocytes, UA: NEGATIVE
Nitrite, UA: NEGATIVE
PH UA: 5
PROTEIN UA: NEGATIVE
RBC UA: NEGATIVE
Spec Grav, UA: <=1.005
Urobilinogen, UA: 0.2

## 2015-04-23 MED ORDER — ESTROGENS, CONJUGATED 0.625 MG/GM VA CREA: 1.0000 | TOPICAL_CREAM | Freq: Every day | VAGINAL | Status: DC

## 2015-04-23 NOTE — Progress Notes (Signed)
Patient: Barbara West, Female    DOB: [birth year], 69 y.o.   MRN: [id number] Visit Date: 04/23/2015  Today's Provider: Halina Maidens, MD   Chief Complaint  Patient presents with  . Medicare Wellness  . Hypertension  . Hyperlipidemia  . Gastroesophageal Reflux   Subjective:    Annual wellness visit Barbara West is a 69 y.o. female who presents today for her Subsequent Annual Wellness Visit. She feels well. She reports exercising very little. She reports she is sleeping fairly she well. She denies breast problems. Mammogram is due in March. She has some discomfort with urination around the urethra.  No frequency or hematuria.  ----------------------------------------------------------- Hypertension This is a chronic problem. The current episode started more than 1 year ago. The problem is controlled. Pertinent negatives include no chest pain, headaches, neck pain or shortness of breath.  Hyperlipidemia This is a chronic problem. The current episode started more than 1 year ago. The problem is controlled. Recent lipid tests were reviewed and are normal. Pertinent negatives include no chest pain or shortness of breath. She is currently on no antihyperlipidemic treatment (side effects to lipitor).  Gastroesophageal Reflux She reports no abdominal pain, no chest pain, no coughing, no dysphagia, no hoarse voice, no nausea or no sore throat. This is a recurrent problem. The current episode started more than 1 year ago. The problem occurs rarely. Pertinent negatives include no fatigue.    Review of Systems  Constitutional: Negative for fever, chills, fatigue and unexpected weight change.  HENT: Negative for hearing loss, hoarse voice, sore throat, tinnitus and trouble swallowing.   Eyes: Negative for visual disturbance.  Respiratory: Negative for cough, chest tightness and shortness of breath.   Cardiovascular: Negative for chest pain.  Gastrointestinal: Negative for dysphagia, nausea,  vomiting, abdominal pain, diarrhea and blood in stool.  Endocrine: Negative for polydipsia and polyuria.  Genitourinary: Positive for dysuria. Negative for hematuria, vaginal bleeding, vaginal discharge and vaginal pain.  Musculoskeletal: Positive for back pain, arthralgias and gait problem. Negative for joint swelling, neck pain and neck stiffness.  Skin:       Very dry skin -   Allergic/Immunologic: Positive for food allergies.  Neurological: Negative for dizziness, tremors, syncope, light-headedness and headaches.  Hematological: Negative for adenopathy. Does not bruise/bleed easily.  Psychiatric/Behavioral: Negative for sleep disturbance, dysphoric mood and decreased concentration.    Social History   Social History  . Marital Status: Married    Spouse Name: N/A  . Number of Children: N/A  . Years of Education: N/A   Occupational History  . Not on file.   Social History Main Topics  . Smoking status: Never Smoker   . Smokeless tobacco: Not on file  . Alcohol Use: 1.2 oz/week    2 Standard drinks or equivalent per week  . Drug Use: No  . Sexual Activity: Not on file   Other Topics Concern  . Not on file   Social History Narrative    Patient Active Problem List   Diagnosis Date Noted  . 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 Avitaminosis D 01/22/2015  . Allergic rhinitis, seasonal 01/22/2015  . Hyperlipidemia 01/22/2015  . 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 Essential (primary) hypertension 01/22/2015  . Gastroesophageal reflux disease with esophagitis 01/22/2015    Past Surgical History  Procedure Laterality Date  . Replacement total knee bilateral      Her family history is not on file.    Previous Medications   AZELASTINE-FLUTICASONE (DYMISTA) 137-50  MCG/ACT SUSP    Place into the nose.   ERGOCALCIFEROL (VITAMIN D2) 50000 UNITS CAPSULE    Take 50,000 Units by mouth once a week.   FEXOFENADINE (ALLEGRA) 180 MG TABLET    Take by mouth.    GABAPENTIN (NEURONTIN) 100 MG CAPSULE    Take by mouth.   HYDROCHLOROTHIAZIDE (HYDRODIURIL) 12.5 MG TABLET    Take by mouth.   IBUPROFEN 200 MG CAPS    Take by mouth.   LANSOPRAZOLE (PREVACID) 30 MG CAPSULE    Take by mouth.   LIDOCAINE (LIDODERM) 5 %       MULTIPLE VITAMINS-MINERALS ER PO    Take by mouth. Reported on 04/23/2015   OLOPATADINE (PATANOL) 0.1 % OPHTHALMIC SOLUTION    Apply to eye.   PREDNISONE (DELTASONE) 5 MG TABLET    Take by mouth.    No care team member to display     Objective:   Vitals: BP 128/80 mmHg  Pulse 76  Ht 5\' 2"  (1.575 m)  Wt 219 lb (99.338 kg)  BMI 40.05 kg/m2  Physical Exam  Constitutional: She is oriented to person, place, and time. She appears well-developed and well-nourished. No distress.  HENT:  Head: Normocephalic and atraumatic.  Right Ear: Tympanic membrane and ear canal normal.  Left Ear: Tympanic membrane and ear canal normal.  Nose: Right sinus exhibits no maxillary sinus tenderness. Left sinus exhibits no maxillary sinus tenderness.  Mouth/Throat: Uvula is midline and oropharynx is clear and moist.  Eyes: Conjunctivae and EOM are normal. Right eye exhibits no discharge. Left eye exhibits no discharge. No scleral icterus.  Neck: Normal range of motion. Carotid bruit is not present. No erythema present. No thyromegaly present.  Cardiovascular: Normal rate, regular rhythm, normal heart sounds and normal pulses.   Pulmonary/Chest: Effort normal. No respiratory distress. She has no wheezes. Right breast exhibits no mass, no nipple discharge, no skin change and no tenderness. Left breast exhibits no mass, no nipple discharge, no skin change and no tenderness.  Abdominal: Soft. Bowel sounds are normal. There is no hepatosplenomegaly. There is no tenderness. There is no CVA tenderness.  Lymphadenopathy:    She has no cervical adenopathy.    She has no axillary adenopathy.  Neurological: She is alert and oriented to person, place, and time. She  has normal strength and normal reflexes. No cranial nerve deficit or sensory deficit.  Skin: Skin is warm, dry and intact. No rash noted.  Psychiatric: She has a normal mood and affect. Her speech is normal and behavior is normal. Thought content normal.  Nursing note and vitals reviewed.   Activities of Daily Living In your present state of health, do you have any difficulty performing the following activities: 01/22/2015  Hearing? N  Vision? N  Difficulty concentrating or making decisions? N  Walking or climbing stairs? N  Dressing or bathing? N  Doing errands, shopping? N    Fall Risk Assessment Fall Risk  01/22/2015  Falls in the past year? No     Depression Screen PHQ 2/9 Scores 01/22/2015  PHQ - 2 Score 0    Cognitive Testing - 6-CIT   Correct? Score   What year is it? yes 0 Yes = 0    No = 4  What month is it? yes 0 Yes = 0    No = 3  Remember:     Pia [address]au, [place], [place]     What time is it? yes 0 Yes =  0    No = 3  Count backwards from 20 to 1 yes 0 Correct = 0    1 error = 2   More than 1 error = 4  Say the months of the year in reverse. yes 0 Correct = 0    1 error = 2   More than 1 error = 4  What address did I ask you to remember? yes 0 Correct = 0  1 error = 2    2 error = 4    3 error = 6    4 error = 8    All wrong = 10       TOTAL SCORE  0/28   Interpretation:  Normal  Normal (0-7) Abnormal (8-28)   Lab Results  Component Value Date   CHOL 356* 02/20/2014   HDL 89* 02/20/2014   LDLCALC 220 02/20/2014   TRIG 233* 02/20/2014      Medicare Annual Wellness Visit Summary:  Reviewed patient's Family Medical History Reviewed and updated list of patient's medical providers Assessment of cognitive impairment was done Assessed patient's functional ability Established a written schedule for health screening [place] Completed and Reviewed  Exercise Activities and Dietary recommendations Goals    . Decrease the  likelihood of falling     Plans to go to [facility] physical therapy/exercise programs to help with balance and endurance       Immunization History  Administered Date(s) Administered  . Influenza-Unspecified 03/06/2015  . Pneumococcal Conjugate-13 02/20/2014    Health Maintenance  Topic Date Due  . Hepatitis C Screening  Sep 15, 1945  . TETANUS/TDAP  08/14/1964  . PNA vac Low Risk Adult (2 of 2 - PPSV23) 02/21/2015  . INFLUENZA VACCINE  12/04/2015  . MAMMOGRAM  07/03/2016  . COLONOSCOPY  05/06/2018  . DEXA SCAN  Completed  . ZOSTAVAX  Addressed     Discussed health benefits of physical activity, and encouraged her to engage in regular exercise appropriate for her age and condition.    ------------------------------------------------------------------------------------------------------------   Assessment & Plan:     1. Medicare annual wellness visit, subsequent Measures satisfied Second Pneumvax given today TDap next visit Schedule mammogram at [facility] - POCT urinalysis dipstick  2. Essential (primary) hypertension Controlled; may want to stop diuretic and begin ACE  3. Hyperlipidemia On no medications; allergic to shellfish so can't take fish oil Consider trial of statin if lipids higher  4. Gastroesophageal reflux disease with esophagitis controlled  5. Avitaminosis D Continue supplementation    Halina Maidens, MD [facility]  04/23/2015

## 2015-04-23 NOTE — Patient Instructions (Addendum)
Health Maintenance  Topic Date Due  . Hepatitis C Screening  03-24-1946  . TETANUS/TDAP  08/14/1964  . ZOSTAVAX  08/14/2005  . PNA vac Low Risk Adult (2 of 2 - PPSV23) 02/21/2015  . INFLUENZA VACCINE  12/04/2015  . MAMMOGRAM  07/03/2016  . COLONOSCOPY  05/06/2018  . DEXA SCAN  Completed   Pneumococcal Polysaccharide Vaccine: What You Need to Know 1. Why get vaccinated? Vaccination can protect older adults (and some children and younger adults) from pneumococcal disease. Pneumococcal disease is caused by bacteria that can spread from person to person through close contact. It can cause ear infections, and it can also lead to more serious infections of the:   Lungs (pneumonia),  Blood (bacteremia), and  Covering of the brain and spinal cord (meningitis). Meningitis can cause deafness and brain damage, and it can be fatal. Anyone can get pneumococcal disease, but children under 65 years of age, people with certain medical conditions, adults over 14 years of age, and cigarette smokers are at the highest risk. About 18,000 older adults die each year from pneumococcal disease in the [place]. Treatment of pneumococcal infections with penicillin and other drugs used to be more effective. But some strains of the disease have become resistant to these drugs. This makes prevention of the disease, through vaccination, even more important. 2. Pneumococcal polysaccharide vaccine (PPSV23) Pneumococcal polysaccharide vaccine (PPSV23) protects against 23 types of pneumococcal bacteria. It will not prevent all pneumococcal disease. PPSV23 is recommended for:  All adults 69 years of age and older,  Anyone 2 through 69 years of age with certain long-term health problems,  Anyone 2 through 69 years of age with a weakened immune system,  Adults 69 through 69 years of age who smoke cigarettes or have asthma. Most people need only one dose of PPSV. A second dose is recommended for certain high-risk  groups. People 69 and older should get a dose even if they have gotten one or more doses of the vaccine before they turned 65. Your healthcare provider can give you more information about these recommendations. Most healthy adults develop protection within 2 to 3 weeks of getting the shot. 3. Some people should not get this vaccine  Anyone who has had a life-threatening allergic reaction to PPSV should not get another dose.  Anyone who has a severe allergy to any component of PPSV should not receive it. Tell your provider if you have any severe allergies.  Anyone who is moderately or severely ill when the shot is scheduled may be asked to wait until they recover before getting the vaccine. Someone with a mild illness can usually be vaccinated.  Children less than 61 years of age should not receive this vaccine.  There is no evidence that PPSV is harmful to either a pregnant woman or to her fetus. However, as a precaution, women who need the vaccine should be vaccinated before becoming pregnant, if possible. 4. Risks of a vaccine reaction With any medicine, including vaccines, there is a chance of side effects. These are usually mild and go away on their own, but serious reactions are also possible. About half of people who get PPSV have mild side effects, such as redness or pain where the shot is given, which go away within about two days. Less than 1 out of 100 people develop a fever, muscle aches, or more severe local reactions. Problems that could happen after any vaccine:  People sometimes faint after a medical procedure, including vaccination. Sitting  or lying down for about 15 minutes can help prevent fainting, and injuries caused by a fall. Tell your doctor if you feel dizzy, or have vision changes or ringing in the ears.  Some people get severe pain in the shoulder and have difficulty moving the arm where a shot was given. This happens very rarely.  Any medication can cause a severe  allergic reaction. Such reactions from a vaccine are very rare, estimated at about 1 in a million doses, and would happen within a few minutes to a few hours after the vaccination. As with any medicine, there is a very remote chance of a vaccine causing a serious injury or death. The safety of vaccines is always being monitored. For more information, visit: http://www.aguilar.org/ 5. What if there is a serious reaction? What should I look for? Look for anything that concerns you, such as signs of a severe allergic reaction, very high fever, or unusual behavior.  Signs of a severe allergic reaction can include hives, swelling of the face and throat, difficulty breathing, a fast heartbeat, dizziness, and weakness. These would usually start a few minutes to a few hours after the vaccination. What should I do? If you think it is a severe allergic reaction or other emergency that can't wait, call 9-1-1 or get to the nearest hospital. Otherwise, call your doctor. Afterward, the reaction should be reported to the Vaccine Adverse Event Reporting System (VAERS). Your doctor might file this report, or you can do it yourself through the VAERS web site at www.vaers.SamedayNews.es, or by calling (571)717-2758.  VAERS does not give medical advice. 6. How can I learn more?  Ask your doctor. He or she can give you the vaccine package insert or suggest other sources of information.  Call your local or state health department.  Contact the Centers for Disease Control and Prevention (CDC):  Call 909-013-2630 (1-800-CDC-INFO) or  Visit CDC's website at http://hunter.com/ CDC Pneumococcal Polysaccharide Vaccine VIS (08/26/13)   This information is not intended to replace advice given to you by your health care provider. Make sure you discuss any questions you have with your health care provider.   Document Released: 02/16/2006 Document Revised: 05/12/2014 Document Reviewed: 08/29/2013 Elsevier Interactive  Patient Education [employer].

## 2015-04-24 LAB — COMPREHENSIVE METABOLIC PANEL
ALBUMIN: 4.1 g/dL (ref 3.6–4.8)
ALK PHOS: 95 IU/L (ref 39–117)
ALT: 10 IU/L (ref 0–32)
AST: 13 IU/L (ref 0–40)
Albumin/Globulin Ratio: 1.6 (ref 1.1–2.5)
BUN / CREAT RATIO: 13 (ref 11–26)
BUN: 13 mg/dL (ref 8–27)
Bilirubin Total: 0.4 mg/dL (ref 0.0–1.2)
CO2: 27 mmol/L (ref 18–29)
CREATININE: 0.98 mg/dL (ref 0.57–1.00)
Calcium: 9.6 mg/dL (ref 8.7–10.3)
Chloride: 98 mmol/L (ref 96–106)
GFR calc non Af Amer: 59 mL/min/{1.73_m2} — ABNORMAL LOW (ref >59)
GFR, EST AFRICAN AMERICAN: 68 mL/min/{1.73_m2} (ref >59)
GLUCOSE: 70 mg/dL (ref 65–99)
Globulin, Total: 2.6 g/dL (ref 1.5–4.5)
Potassium: 4.2 mmol/L (ref 3.5–5.2)
Sodium: 141 mmol/L (ref 134–144)
TOTAL PROTEIN: 6.7 g/dL (ref 6.0–8.5)

## 2015-04-24 LAB — CBC WITH DIFFERENTIAL/PLATELET
BASOS: 0 %
Basophils Absolute: 0 10*3/uL (ref 0.0–0.2)
EOS (ABSOLUTE): 0.2 10*3/uL (ref 0.0–0.4)
Eos: 2 %
HEMOGLOBIN: 13.9 g/dL (ref 11.1–15.9)
Hematocrit: 42.8 % (ref 34.0–46.6)
IMMATURE GRANS (ABS): 0 10*3/uL (ref 0.0–0.1)
Immature Granulocytes: 0 %
LYMPHS: 33 %
Lymphocytes Absolute: 3.6 10*3/uL — ABNORMAL HIGH (ref 0.7–3.1)
MCH: 28.8 pg (ref 26.6–33.0)
MCHC: 32.5 g/dL (ref 31.5–35.7)
MCV: 89 fL (ref 79–97)
MONOCYTES: 13 %
Monocytes Absolute: 1.4 10*3/uL — ABNORMAL HIGH (ref 0.1–0.9)
NEUTROS ABS: 5.7 10*3/uL (ref 1.4–7.0)
Neutrophils: 52 %
Platelets: 332 10*3/uL (ref 150–379)
RBC: 4.82 x10E6/uL (ref 3.77–5.28)
RDW: 14.4 % (ref 12.3–15.4)
WBC: 11 10*3/uL — ABNORMAL HIGH (ref 3.4–10.8)

## 2015-04-24 LAB — LIPID PANEL
CHOLESTEROL TOTAL: 333 mg/dL — AB (ref 100–199)
Chol/HDL Ratio: 4.2 ratio units (ref 0.0–4.4)
HDL: 79 mg/dL (ref >39)
LDL CALC: 203 mg/dL — AB (ref 0–99)
Triglycerides: 257 mg/dL — ABNORMAL HIGH (ref 0–149)
VLDL CHOLESTEROL CAL: 51 mg/dL — AB (ref 5–40)

## 2015-04-24 LAB — TSH: TSH: 2.38 u[IU]/mL (ref 0.450–4.500)

## 2015-04-24 LAB — HEPATITIS C ANTIBODY: Hep C Virus Ab: <0.1 s/co ratio (ref 0.0–0.9)

## 2015-05-04 DIAGNOSIS — M545 Low back pain: Secondary | ICD-10-CM | POA: Diagnosis not present

## 2015-05-11 DIAGNOSIS — M545 Low back pain: Secondary | ICD-10-CM | POA: Diagnosis not present

## 2015-05-24 DIAGNOSIS — M545 Low back pain: Secondary | ICD-10-CM | POA: Diagnosis not present

## 2015-05-24 DIAGNOSIS — M47816 Spondylosis without myelopathy or radiculopathy, lumbar region: Secondary | ICD-10-CM | POA: Diagnosis not present

## 2015-05-28 DIAGNOSIS — H52223 Regular astigmatism, bilateral: Secondary | ICD-10-CM | POA: Diagnosis not present

## 2015-05-28 DIAGNOSIS — H524 Presbyopia: Secondary | ICD-10-CM | POA: Diagnosis not present

## 2015-05-28 DIAGNOSIS — H04123 Dry eye syndrome of bilateral lacrimal glands: Secondary | ICD-10-CM | POA: Diagnosis not present

## 2015-05-28 DIAGNOSIS — I10 Essential (primary) hypertension: Secondary | ICD-10-CM | POA: Diagnosis not present

## 2015-05-28 DIAGNOSIS — H5203 Hypermetropia, bilateral: Secondary | ICD-10-CM | POA: Diagnosis not present

## 2015-05-28 DIAGNOSIS — H25813 Combined forms of age-related cataract, bilateral: Secondary | ICD-10-CM | POA: Diagnosis not present

## 2015-06-01 DIAGNOSIS — H61031 Chondritis of right external ear: Secondary | ICD-10-CM | POA: Diagnosis not present

## 2015-06-01 DIAGNOSIS — L570 Actinic keratosis: Secondary | ICD-10-CM | POA: Diagnosis not present

## 2015-06-09 ENCOUNTER — Other Ambulatory Visit: Payer: Self-pay | Admitting: Internal Medicine

## 2015-06-15 DIAGNOSIS — M7061 Trochanteric bursitis, right hip: Secondary | ICD-10-CM | POA: Diagnosis not present

## 2015-07-03 DIAGNOSIS — M545 Low back pain: Secondary | ICD-10-CM | POA: Diagnosis not present

## 2015-07-09 DIAGNOSIS — M545 Low back pain: Secondary | ICD-10-CM | POA: Diagnosis not present

## 2015-07-11 ENCOUNTER — Ambulatory Visit (INDEPENDENT_AMBULATORY_CARE_PROVIDER_SITE_OTHER): Payer: Medicare Other | Admitting: Internal Medicine

## 2015-07-11 ENCOUNTER — Encounter: Payer: Self-pay | Admitting: Internal Medicine

## 2015-07-11 VITALS — BP 122/70 | HR 110 | Ht 62.0 in | Wt 213.0 lb

## 2015-07-11 DIAGNOSIS — K2100 Gastro-esophageal reflux disease with esophagitis, without bleeding: Secondary | ICD-10-CM

## 2015-07-11 DIAGNOSIS — I10 Essential (primary) hypertension: Secondary | ICD-10-CM | POA: Diagnosis not present

## 2015-07-11 DIAGNOSIS — M545 Low back pain: Secondary | ICD-10-CM | POA: Diagnosis not present

## 2015-07-11 NOTE — Progress Notes (Signed)
Date:  07/11/2015   Name:  Barbara West   DOB:  1945/11/16   MRN:  [id number]   Chief Complaint: Abdominal Pain Abdominal Pain This is a new problem. The current episode started 1 to 4 weeks ago. The problem occurs 2 to 4 times per day. The problem has been waxing and waning. The pain is located in the epigastric region. The abdominal pain radiates to the epigastric region. Associated symptoms include anorexia and nausea. Pertinent negatives include no constipation, diarrhea, dysuria, fever or vomiting.  Hypertension This is a chronic problem. The current episode started more than 1 year ago. The problem is unchanged. The problem is controlled. Pertinent negatives include no chest pain or shortness of breath.  She feels okay if she doesn't eat but gets hypoglycemic so has tried to eat.  She gets queezy after eating - no vomiting - that lasts about an hour.  This all started after a stomach virus three weeks ago with vomiting and diarrhea.   Review of Systems  Constitutional: Negative for fever and chills.  Respiratory: Negative for chest tightness, shortness of breath and wheezing.   Cardiovascular: Negative for chest pain.  Gastrointestinal: Positive for nausea, abdominal pain, abdominal distention and anorexia. Negative for vomiting, diarrhea, constipation and blood in stool.  Genitourinary: Negative for dysuria.    Patient Active Problem List   Diagnosis Date Noted  . 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 Avitaminosis D 01/22/2015  . Allergic rhinitis, seasonal 01/22/2015  . Hyperlipidemia 01/22/2015  . 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 Essential (primary) hypertension 01/22/2015  . Gastroesophageal reflux disease with esophagitis 01/22/2015    Prior to Admission medications   Medication Sig Start Date End Date Taking? Authorizing Provider  Azelastine-Fluticasone (DYMISTA) 137-50 MCG/ACT SUSP Place into the nose.    Historical Provider, MD  conjugated estrogens  (PREMARIN) vaginal cream Place 1 Applicatorful vaginally daily. 04/23/15   Glean Hess, MD  ergocalciferol (VITAMIN D2) 50000 UNITS capsule Take 50,000 Units by mouth once a week.    Historical Provider, MD  fexofenadine (ALLEGRA) 180 MG tablet Take by mouth.    Historical Provider, MD  gabapentin (NEURONTIN) 100 MG capsule Take by mouth.    Historical Provider, MD  hydrochlorothiazide (HYDRODIURIL) 12.5 MG tablet TAKE 1 TABLET BY MOUTH EVERY DAY 06/09/15   Glean Hess, MD  Ibuprofen 200 MG CAPS Take by mouth.    Historical Provider, MD  lansoprazole (PREVACID) 30 MG capsule Take by mouth. 02/20/14   Historical Provider, MD  lidocaine (LIDODERM) 5 %  03/30/15   Historical Provider, MD  MULTIPLE VITAMINS-MINERALS ER PO Take by mouth. Reported on 04/23/2015    Historical Provider, MD  olopatadine (PATANOL) 0.1 % ophthalmic solution Apply to eye.    Historical Provider, MD  predniSONE (DELTASONE) 5 MG tablet Take by mouth.    Historical Provider, MD    Allergies  Allergen Reactions  . Celecoxib Anaphylaxis  . Atorvastatin   . Clarithromycin Other (See Comments)  . Codeine Other (See Comments), Nausea Only and Nausea And Vomiting  . Losartan Potassium Other (See Comments)  . Morphine And Related Other (See Comments), Nausea Only and Nausea And Vomiting  . Shellfish Allergy Nausea Only  . Sulfa Antibiotics Nausea And Vomiting  . Mupirocin Rash    Cream/ointment.  . Penicillins Rash    Past Surgical History  Procedure Laterality Date  . Replacement total knee bilateral Bilateral     Social History  Substance Use Topics  .  Smoking status: Never Smoker   . Smokeless tobacco: None  . Alcohol Use: 1.2 oz/week    2 Standard drinks or equivalent per week     Comment: once weekly      Medication list has been reviewed and updated.   Physical Exam  Constitutional: She is oriented to person, place, and time. She appears well-developed. No distress.  HENT:  Head:  Normocephalic and atraumatic.  Cardiovascular: Normal rate, regular rhythm and normal heart sounds.   Pulmonary/Chest: Effort normal and breath sounds normal. No respiratory distress.  Abdominal: Soft. Normal appearance and bowel sounds are normal. There is tenderness in the epigastric area. There is no rigidity, no guarding, no CVA tenderness, no tenderness at McBurney's point and negative Murphy's sign.  Musculoskeletal: Normal range of motion.  Neurological: She is alert and oriented to person, place, and time.  Skin: Skin is warm and dry. No rash noted.  Psychiatric: She has a normal mood and affect. Her behavior is normal. Thought content normal.    BP 122/70 mmHg  Pulse 110  Ht 5\' 2"  (1.575 m)  Wt 213 lb (96.616 kg)  BMI 38.95 kg/m2  Assessment and Plan: 1. Gastroesophageal reflux disease with esophagitis Concern for possible ulcer or gastritis but she is taking daily PPI  May need GI evaluation if H Pylori neg and symptoms do not resolve over the next week - H. pylori antibody, IgG  2. Essential (primary) hypertension controlled   Halina Maidens, MD [facility]  07/11/2015

## 2015-07-13 ENCOUNTER — Encounter: Payer: Self-pay | Admitting: *Deleted

## 2015-07-13 DIAGNOSIS — M064 Inflammatory polyarthropathy: Secondary | ICD-10-CM | POA: Diagnosis not present

## 2015-07-13 LAB — H. PYLORI ANTIBODY, IGG: H PYLORI IGG: 0.9 U/mL — AB (ref 0.0–0.8)

## 2015-07-16 ENCOUNTER — Telehealth: Payer: Self-pay

## 2015-07-16 DIAGNOSIS — M545 Low back pain: Secondary | ICD-10-CM | POA: Diagnosis not present

## 2015-07-16 NOTE — Telephone Encounter (Signed)
Spoke with patient. Patient advised of all results and verbalized understanding. Will call back with any future questions or concerns. MAH  

## 2015-07-16 NOTE — Telephone Encounter (Signed)
-----   Message from Glean Hess, MD sent at 07/13/2015  1:29 PM EST ----- H Pylori test is borderline.  I would not treat at this time.  If symptoms continue, I would refer to GI.

## 2015-07-19 DIAGNOSIS — M545 Low back pain: Secondary | ICD-10-CM | POA: Diagnosis not present

## 2015-07-23 DIAGNOSIS — M545 Low back pain: Secondary | ICD-10-CM | POA: Diagnosis not present

## 2015-07-25 DIAGNOSIS — M7062 Trochanteric bursitis, left hip: Secondary | ICD-10-CM | POA: Diagnosis not present

## 2015-07-25 DIAGNOSIS — M545 Low back pain: Secondary | ICD-10-CM | POA: Diagnosis not present

## 2015-07-25 DIAGNOSIS — M7061 Trochanteric bursitis, right hip: Secondary | ICD-10-CM | POA: Diagnosis not present

## 2015-07-26 DIAGNOSIS — M545 Low back pain: Secondary | ICD-10-CM | POA: Diagnosis not present

## 2015-07-31 DIAGNOSIS — M545 Low back pain: Secondary | ICD-10-CM | POA: Diagnosis not present

## 2015-08-08 DIAGNOSIS — M545 Low back pain: Secondary | ICD-10-CM | POA: Diagnosis not present

## 2015-08-09 DIAGNOSIS — Z85828 Personal history of other malignant neoplasm of skin: Secondary | ICD-10-CM | POA: Diagnosis not present

## 2015-08-09 DIAGNOSIS — L720 Epidermal cyst: Secondary | ICD-10-CM | POA: Diagnosis not present

## 2015-08-09 DIAGNOSIS — L820 Inflamed seborrheic keratosis: Secondary | ICD-10-CM | POA: Diagnosis not present

## 2015-08-09 DIAGNOSIS — H02829 Cysts of unspecified eye, unspecified eyelid: Secondary | ICD-10-CM | POA: Diagnosis not present

## 2015-08-09 DIAGNOSIS — L821 Other seborrheic keratosis: Secondary | ICD-10-CM | POA: Diagnosis not present

## 2015-08-09 DIAGNOSIS — D1801 Hemangioma of skin and subcutaneous tissue: Secondary | ICD-10-CM | POA: Diagnosis not present

## 2015-08-09 DIAGNOSIS — D485 Neoplasm of uncertain behavior of skin: Secondary | ICD-10-CM | POA: Diagnosis not present

## 2015-08-10 DIAGNOSIS — M545 Low back pain: Secondary | ICD-10-CM | POA: Diagnosis not present

## 2015-08-14 DIAGNOSIS — M545 Low back pain: Secondary | ICD-10-CM | POA: Diagnosis not present

## 2015-08-17 DIAGNOSIS — M545 Low back pain: Secondary | ICD-10-CM | POA: Diagnosis not present

## 2015-08-20 DIAGNOSIS — M545 Low back pain: Secondary | ICD-10-CM | POA: Diagnosis not present

## 2015-08-23 DIAGNOSIS — M545 Low back pain: Secondary | ICD-10-CM | POA: Diagnosis not present

## 2015-08-24 DIAGNOSIS — Z1231 Encounter for screening mammogram for malignant neoplasm of breast: Secondary | ICD-10-CM | POA: Diagnosis not present

## 2015-08-27 DIAGNOSIS — M545 Low back pain: Secondary | ICD-10-CM | POA: Diagnosis not present

## 2015-08-30 DIAGNOSIS — M545 Low back pain: Secondary | ICD-10-CM | POA: Diagnosis not present

## 2015-08-31 DIAGNOSIS — M7062 Trochanteric bursitis, left hip: Secondary | ICD-10-CM | POA: Diagnosis not present

## 2015-08-31 DIAGNOSIS — M7061 Trochanteric bursitis, right hip: Secondary | ICD-10-CM | POA: Diagnosis not present

## 2015-10-12 DIAGNOSIS — M7061 Trochanteric bursitis, right hip: Secondary | ICD-10-CM | POA: Diagnosis not present

## 2015-10-22 ENCOUNTER — Encounter: Payer: Self-pay | Admitting: Internal Medicine

## 2015-10-22 ENCOUNTER — Ambulatory Visit (INDEPENDENT_AMBULATORY_CARE_PROVIDER_SITE_OTHER): Payer: Medicare Other | Admitting: Internal Medicine

## 2015-10-22 VITALS — BP 135/84 | HR 74 | Resp 16 | Ht 62.0 in | Wt 206.0 lb

## 2015-10-22 DIAGNOSIS — K2100 Gastro-esophageal reflux disease with esophagitis, without bleeding: Secondary | ICD-10-CM

## 2015-10-22 DIAGNOSIS — I10 Essential (primary) hypertension: Secondary | ICD-10-CM | POA: Diagnosis not present

## 2015-10-22 MED ORDER — LANSOPRAZOLE 30 MG PO CPDR: 30.0000 mg | DELAYED_RELEASE_CAPSULE | Freq: Two times a day (BID) | ORAL | Status: DC

## 2015-10-22 NOTE — Progress Notes (Signed)
Date:  10/22/2015   Name:  Barbara West   DOB:  1946/02/03   MRN:  [id number]   Chief Complaint: Gastroesophageal Reflux and Hypertension Gastroesophageal Reflux She complains of heartburn. She reports no chest pain or no coughing. This is a recurrent problem. The problem occurs rarely. Pertinent negatives include no fatigue. She has tried a PPI for the symptoms.  Hypertension This is a chronic problem. The current episode started more than 1 year ago. The problem is unchanged. The problem is controlled. Pertinent negatives include no chest pain, headaches, palpitations or shortness of breath. Past treatments include diuretics. The current treatment provides significant improvement.    Review of Systems  Constitutional: Negative for fever, chills, fatigue and unexpected weight change (16 lbs with effort).  Eyes: Negative for visual disturbance.  Respiratory: Negative for cough, chest tightness and shortness of breath.   Cardiovascular: Negative for chest pain, palpitations and leg swelling.  Gastrointestinal: Positive for heartburn.  Musculoskeletal: Positive for arthralgias and gait problem.  Neurological: Negative for dizziness, syncope, numbness and headaches.  Hematological: Negative for adenopathy.  Psychiatric/Behavioral: Negative for dysphoric mood.    Patient Active Problem List   Diagnosis Date Noted  . 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 Avitaminosis D 01/22/2015  . Allergic rhinitis, seasonal 01/22/2015  . Hyperlipidemia 01/22/2015  . 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 Essential (primary) hypertension 01/22/2015  . Gastroesophageal reflux disease with esophagitis 01/22/2015    Prior to Admission medications   Medication Sig Start Date End Date Taking? Authorizing Provider  Azelastine-Fluticasone (DYMISTA) 137-50 MCG/ACT SUSP Place into the nose.   Yes Historical Provider, MD  conjugated estrogens (PREMARIN) vaginal cream Place 1 Applicatorful vaginally  daily. 04/23/15  Yes Glean Hess, MD  ergocalciferol (VITAMIN D2) 50000 UNITS capsule Take 50,000 Units by mouth once a week.   Yes Historical Provider, MD  fexofenadine (ALLEGRA) 180 MG tablet Take by mouth.   Yes Historical Provider, MD  gabapentin (NEURONTIN) 100 MG capsule Take by mouth.   Yes Historical Provider, MD  hydrochlorothiazide (HYDRODIURIL) 12.5 MG tablet TAKE 1 TABLET BY MOUTH EVERY DAY 06/09/15  Yes Glean Hess, MD  Ibuprofen 200 MG CAPS Take by mouth.   Yes Historical Provider, MD  lansoprazole (PREVACID) 30 MG capsule Take by mouth. 02/20/14  Yes Historical Provider, MD  lidocaine (LIDODERM) 5 %  03/30/15  Yes Historical Provider, MD  Multiple Vitamins-Minerals (OCUVITE EXTRA PO) Take by mouth.   Yes Historical Provider, MD  olopatadine (PATANOL) 0.1 % ophthalmic solution Apply to eye.   Yes Historical Provider, MD  predniSONE (DELTASONE) 5 MG tablet Take by mouth.   Yes Historical Provider, MD  vitamin E 400 UNIT capsule Take 400 Units by mouth daily.   Yes Historical Provider, MD    Allergies  Allergen Reactions  . Celecoxib Anaphylaxis  . Atorvastatin   . Clarithromycin Other (See Comments)  . Codeine Other (See Comments), Nausea Only and Nausea And Vomiting  . Losartan Potassium Other (See Comments)  . Morphine And Related Other (See Comments), Nausea Only and Nausea And Vomiting  . Shellfish Allergy Nausea Only  . Sulfa Antibiotics Nausea And Vomiting  . Mupirocin Rash    Cream/ointment.  . Penicillins Rash    Past Surgical History  Procedure Laterality Date  . Replacement total knee bilateral Bilateral     Social History  Substance Use Topics  . Smoking status: Never Smoker   . Smokeless tobacco: None  . Alcohol Use: 1.2  oz/week    2 Standard drinks or equivalent per week     Comment: once weekly      Medication list has been reviewed and updated.   Physical Exam  Constitutional: She is oriented to person, place, and time. She appears  well-developed and well-nourished.  Neck: Normal range of motion. Neck supple. No thyromegaly present.  Cardiovascular: Normal rate, regular rhythm and normal heart sounds.   Pulmonary/Chest: Effort normal and breath sounds normal. She has no wheezes. She has no rales.  Musculoskeletal: She exhibits no edema.  Neurological: She is alert and oriented to person, place, and time.  Skin: Skin is warm and dry.  Psychiatric: She has a normal mood and affect. Her behavior is normal. Thought content normal.  Nursing note and vitals reviewed.   BP 135/84 mmHg  Pulse 74  Resp 16  Ht 5\' 2"  (1.575 m)  Wt 206 lb (93.441 kg)  BMI 37.67 kg/m2  SpO2 98%  Assessment and Plan: 1. Essential (primary) hypertension Controlled - continue current therapy  2. Gastroesophageal reflux disease with esophagitis Doing well on PPI daily - lansoprazole (PREVACID) 30 MG capsule; Take 1 capsule (30 mg total) by mouth 2 (two) times daily before a meal.  Dispense: 180 capsule; Refill: [place], MD [facility]  10/22/2015

## 2015-11-23 DIAGNOSIS — M7061 Trochanteric bursitis, right hip: Secondary | ICD-10-CM | POA: Diagnosis not present

## 2015-11-23 DIAGNOSIS — M7062 Trochanteric bursitis, left hip: Secondary | ICD-10-CM | POA: Diagnosis not present

## 2015-12-03 IMAGING — CR DG CHEST 2V
1 series · 3 of 3 positions shown · non-contrast
Comparison: None.

CLINICAL DATA: Cough, history of bronchitis

EXAM:
CHEST  2 VIEW

[Series 1: pa · 0.17mm/px · 3 of 3 slices shown]
[im 1/3]
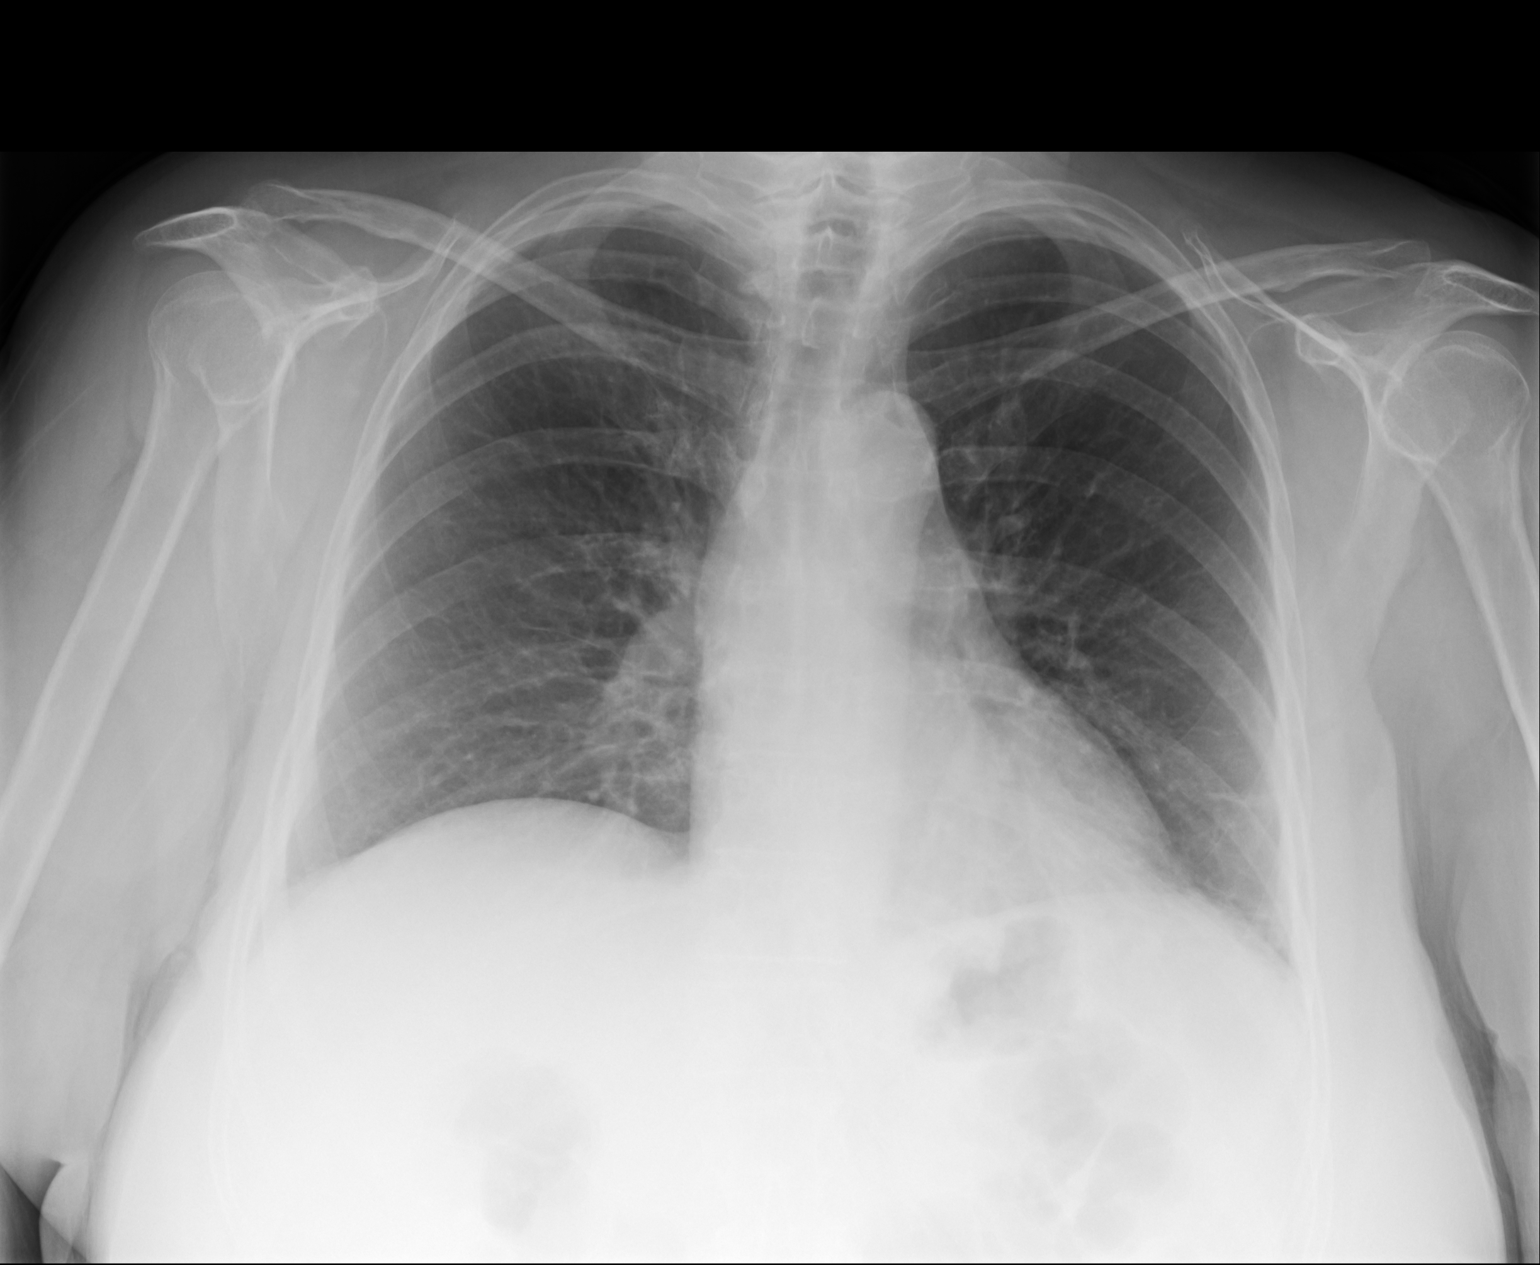
[im 2/3]
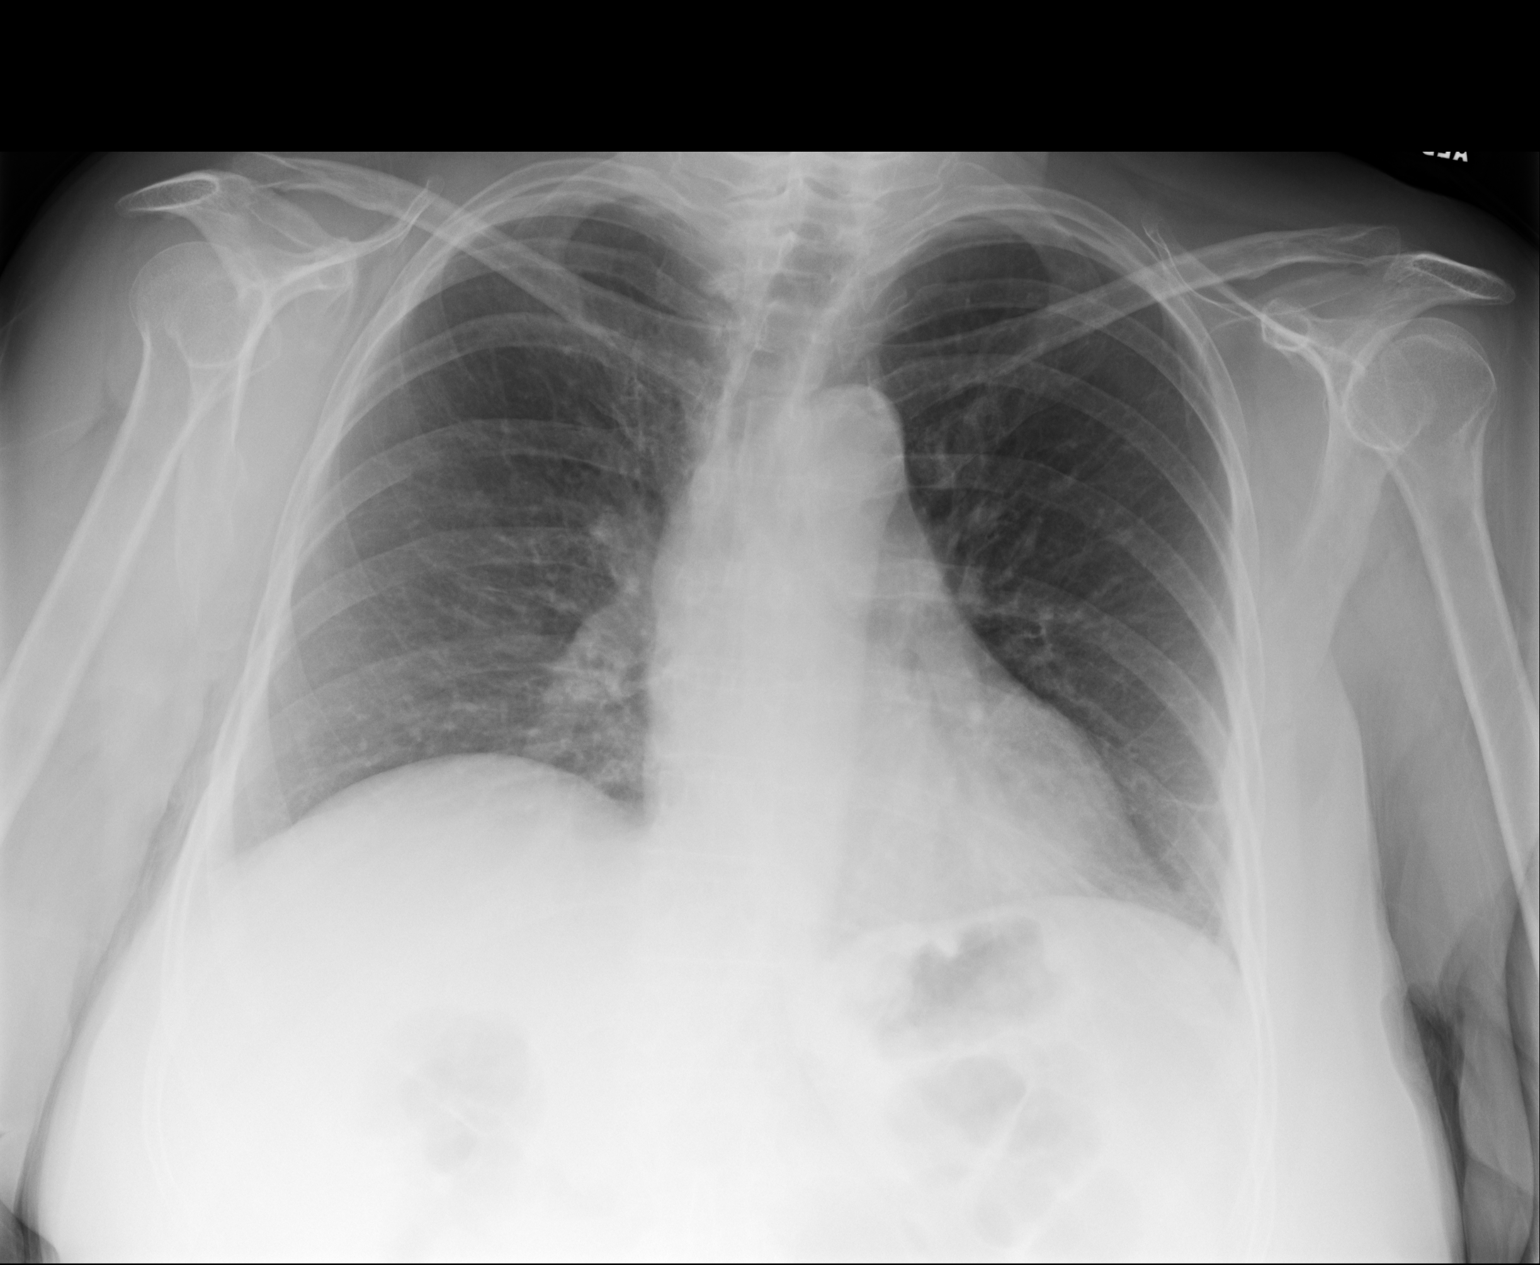
[im 3/3]
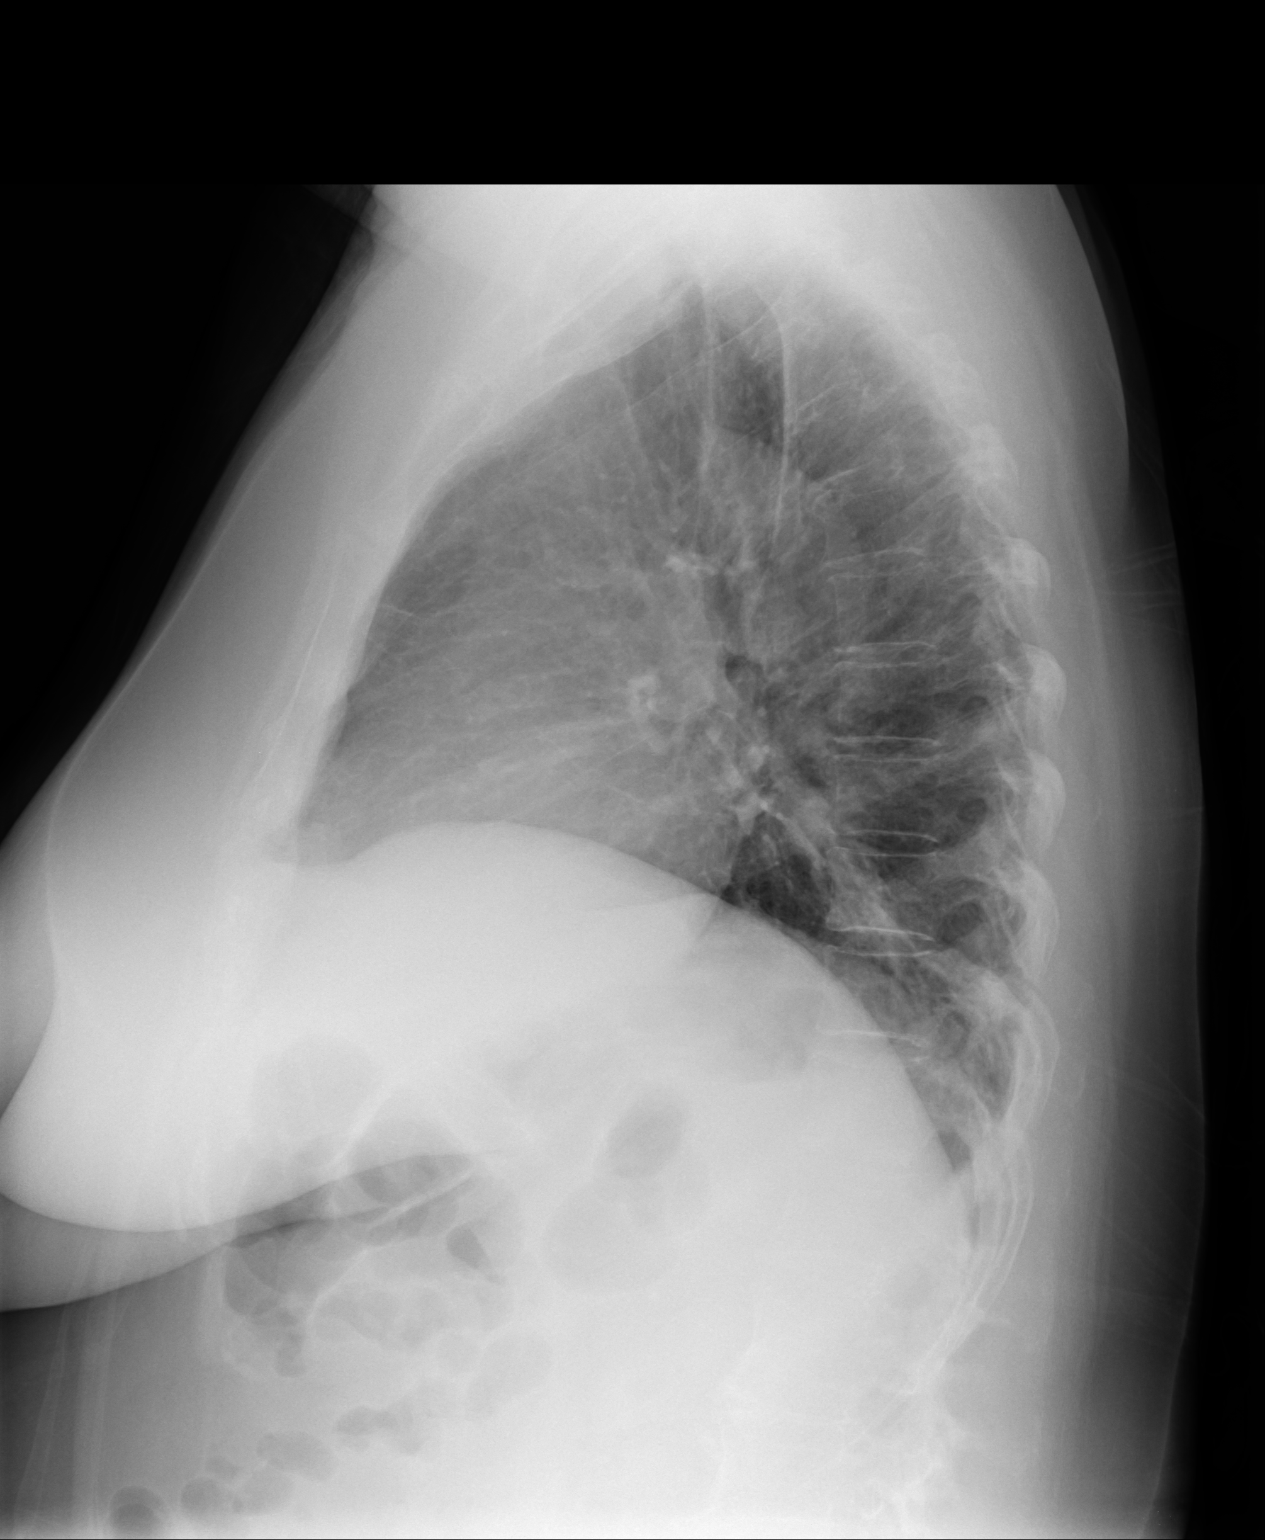

[3 of 3 positions shown; findings below may reference images not displayed]

FINDINGS: Low lung volumes. The heart size and mediastinal contours are within
normal limits. Both lungs are clear. The visualized skeletal
structures are unremarkable.
IMPRESSION: No active cardiopulmonary disease.

## 2016-01-02 DIAGNOSIS — M7062 Trochanteric bursitis, left hip: Secondary | ICD-10-CM | POA: Diagnosis not present

## 2016-01-02 DIAGNOSIS — M7061 Trochanteric bursitis, right hip: Secondary | ICD-10-CM | POA: Diagnosis not present

## 2016-01-31 DIAGNOSIS — M064 Inflammatory polyarthropathy: Secondary | ICD-10-CM | POA: Diagnosis not present

## 2016-01-31 DIAGNOSIS — M353 Polymyalgia rheumatica: Secondary | ICD-10-CM | POA: Diagnosis not present

## 2016-02-06 DIAGNOSIS — Z23 Encounter for immunization: Secondary | ICD-10-CM | POA: Diagnosis not present

## 2016-02-15 DIAGNOSIS — M7061 Trochanteric bursitis, right hip: Secondary | ICD-10-CM | POA: Diagnosis not present

## 2016-02-18 DIAGNOSIS — C44729 Squamous cell carcinoma of skin of left lower limb, including hip: Secondary | ICD-10-CM | POA: Diagnosis not present

## 2016-02-18 DIAGNOSIS — M71341 Other bursal cyst, right hand: Secondary | ICD-10-CM | POA: Diagnosis not present

## 2016-02-18 DIAGNOSIS — L570 Actinic keratosis: Secondary | ICD-10-CM | POA: Diagnosis not present

## 2016-02-18 DIAGNOSIS — Z85828 Personal history of other malignant neoplasm of skin: Secondary | ICD-10-CM | POA: Diagnosis not present

## 2016-02-18 DIAGNOSIS — D485 Neoplasm of uncertain behavior of skin: Secondary | ICD-10-CM | POA: Diagnosis not present

## 2016-02-18 DIAGNOSIS — B078 Other viral warts: Secondary | ICD-10-CM | POA: Diagnosis not present

## 2016-02-18 DIAGNOSIS — L821 Other seborrheic keratosis: Secondary | ICD-10-CM | POA: Diagnosis not present

## 2016-03-03 ENCOUNTER — Ambulatory Visit (INDEPENDENT_AMBULATORY_CARE_PROVIDER_SITE_OTHER): Payer: Medicare Other | Admitting: Internal Medicine

## 2016-03-03 ENCOUNTER — Encounter: Payer: Self-pay | Admitting: Internal Medicine

## 2016-03-03 VITALS — BP 122/82 | HR 78 | Resp 16 | Ht 62.0 in | Wt 206.4 lb

## 2016-03-03 DIAGNOSIS — I10 Essential (primary) hypertension: Secondary | ICD-10-CM | POA: Diagnosis not present

## 2016-03-03 DIAGNOSIS — K2100 Gastro-esophageal reflux disease with esophagitis, without bleeding: Secondary | ICD-10-CM

## 2016-03-03 MED ORDER — RANITIDINE HCL 150 MG PO TABS: 150.0000 mg | ORAL_TABLET | Freq: Two times a day (BID) | ORAL | 1 refills | Status: DC

## 2016-03-03 NOTE — Progress Notes (Signed)
Date:  03/03/2016   Name:  Barbara West   DOB:  Sep 29, 1945   MRN:  [id number]   Chief Complaint: Ulcer (Stomach ulcer after stopping prednisone. Pain right under sternum. No NVD. Stopped prednisone 2 weeks ago and nausea came then acid reflux on prevacid and now feels like a throbbing ache. ) Gastroesophageal Reflux  She complains of abdominal pain and heartburn. She reports no chest pain, no sore throat, no water brash or no wheezing. This is a recurrent problem. The problem occurs frequently. The problem has been unchanged. The heartburn is located in the substernum. The heartburn is of moderate intensity. The symptoms are aggravated by stress. Risk factors include hiatal hernia. She has tried a PPI for the symptoms. The treatment provided no relief (just doubled prevacid 4 days ago). Past procedures include an EGD.  Hypertension  This is a chronic problem. The current episode started more than 1 year ago. The problem is controlled. Pertinent negatives include no chest pain or palpitations.   Tapered quickly off of prednisone that she had been on for 3 years.  Sx started at that time.  Was already on prevacid 30 mg per day.  Also Gaviscon after meals.    Review of Systems  Constitutional: Negative for chills, fever and unexpected weight change.  HENT: Negative for sore throat.   Respiratory: Negative for chest tightness and wheezing.   Cardiovascular: Negative for chest pain, palpitations and leg swelling.  Gastrointestinal: Positive for abdominal pain and heartburn. Negative for blood in stool, constipation, diarrhea and vomiting.    Patient Active Problem List   Diagnosis Date Noted  . 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 Avitaminosis D 01/22/2015  . Allergic rhinitis, seasonal 01/22/2015  . Hyperlipidemia 01/22/2015  . 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 Essential (primary) hypertension 01/22/2015  . Gastroesophageal reflux disease with esophagitis 01/22/2015     Prior to Admission medications   Medication Sig Start Date End Date Taking? Authorizing Provider  Azelastine-Fluticasone (DYMISTA) 137-50 MCG/ACT SUSP Place into the nose.   Yes Historical Provider, MD  conjugated estrogens (PREMARIN) vaginal cream Place 1 Applicatorful vaginally daily. 04/23/15  Yes Glean Hess, MD  fexofenadine (ALLEGRA) 180 MG tablet Take by mouth.   Yes Historical Provider, MD  gabapentin (NEURONTIN) 100 MG capsule Take by mouth.   Yes Historical Provider, MD  hydrochlorothiazide (HYDRODIURIL) 12.5 MG tablet TAKE 1 TABLET BY MOUTH EVERY DAY 06/09/15  Yes Glean Hess, MD  Ibuprofen 200 MG CAPS Take by mouth.   Yes Historical Provider, MD  lansoprazole (PREVACID) 30 MG capsule Take 1 capsule (30 mg total) by mouth 2 (two) times daily before a meal. 10/22/15  Yes Glean Hess, MD  lidocaine (LIDODERM) 5 %  03/30/15  Yes Historical Provider, MD  Multiple Vitamins-Minerals (OCUVITE EXTRA PO) Take by mouth.   Yes Historical Provider, MD  olopatadine (PATANOL) 0.1 % ophthalmic solution Apply to eye.   Yes Historical Provider, MD  vitamin E 400 UNIT capsule Take 400 Units by mouth daily.   Yes Historical Provider, MD    Allergies  Allergen Reactions  . Celecoxib Anaphylaxis  . Atorvastatin   . Clarithromycin Other (See Comments)  . Codeine Other (See Comments), Nausea Only and Nausea And Vomiting  . Losartan Potassium Other (See Comments)  . Morphine And Related Other (See Comments), Nausea Only and Nausea And Vomiting  . Shellfish Allergy Nausea Only  . Sulfa Antibiotics Nausea And Vomiting  . Mupirocin Rash  Cream/ointment.  . Penicillins Rash    Past Surgical History:  Procedure Laterality Date  . REPLACEMENT TOTAL KNEE BILATERAL Bilateral     Social History  Substance Use Topics  . Smoking status: Never Smoker  . Smokeless tobacco: Never Used  . Alcohol use 1.2 oz/week    2 Standard drinks or equivalent per week     Comment: once weekly       Medication list has been reviewed and updated.   Physical Exam  Constitutional: She is oriented to person, place, and time. She appears well-developed. No distress.  HENT:  Head: Normocephalic and atraumatic.  Cardiovascular: Normal rate, regular rhythm and normal heart sounds.   Pulmonary/Chest: Effort normal and breath sounds normal. No respiratory distress.  Abdominal: Soft. Normal appearance and bowel sounds are normal. There is no hepatosplenomegaly. There is tenderness in the epigastric area. There is no rigidity, no guarding and no CVA tenderness.  Musculoskeletal: Normal range of motion.  Neurological: She is alert and oriented to person, place, and time.  Skin: Skin is warm and dry. No rash noted.  Psychiatric: She has a normal mood and affect. Her behavior is normal. Thought content normal.  Nursing note and vitals reviewed.   BP 122/82   Pulse 78   Resp 16   Ht 5\' 2"  (1.575 m)   Wt 206 lb 6.4 oz (93.6 kg)   SpO2 98%   BMI 37.75 kg/m   Assessment and Plan: 1. Gastroesophageal reflux disease with esophagitis Continue bid prevacid Continue PC gaviscon Add ranitidine Call if no improvement in 1-2 weeks - consider UGI series - ranitidine (ZANTAC) 150 MG tablet; Take 1 tablet (150 mg total) by mouth 2 (two) times daily.  Dispense: 60 tablet; Refill: 1  2. Essential (primary) hypertension controlled   Halina Maidens, MD [facility]  03/03/2016

## 2016-03-17 ENCOUNTER — Encounter: Payer: Self-pay | Admitting: Internal Medicine

## 2016-03-17 ENCOUNTER — Ambulatory Visit (INDEPENDENT_AMBULATORY_CARE_PROVIDER_SITE_OTHER): Payer: Medicare Other | Admitting: Internal Medicine

## 2016-03-17 VITALS — BP 120/84 | HR 97 | Temp 98.1°F | Resp 16 | Ht 62.0 in | Wt 204.0 lb

## 2016-03-17 DIAGNOSIS — J40 Bronchitis, not specified as acute or chronic: Secondary | ICD-10-CM | POA: Diagnosis not present

## 2016-03-17 MED ORDER — BENZONATATE 100 MG PO CAPS: 100.0000 mg | ORAL_CAPSULE | Freq: Two times a day (BID) | ORAL | 0 refills | Status: DC | PRN

## 2016-03-17 MED ORDER — ALBUTEROL SULFATE HFA 108 (90 BASE) MCG/ACT IN AERS: 2.0000 | INHALATION_SPRAY | Freq: Four times a day (QID) | RESPIRATORY_TRACT | 0 refills | Status: DC | PRN

## 2016-03-17 MED ORDER — DOXYCYCLINE HYCLATE 100 MG PO TABS: 100.0000 mg | ORAL_TABLET | Freq: Two times a day (BID) | ORAL | 0 refills | Status: DC

## 2016-03-17 NOTE — Progress Notes (Signed)
Date:  03/17/2016   Name:  Barbara West   DOB:  [birth year]   MRN:  [id number]   Chief Complaint: Cough (Started yesterday bodyaches and wakes up with nausea and feeling like there is someone sitting on chest. Has felt cold and chills. ) Cough  This is a new problem. The current episode started yesterday. The problem has been gradually worsening. The problem occurs every few minutes. The cough is non-productive. Associated symptoms include chills. Pertinent negatives include no chest pain, fever, sore throat, shortness of breath or wheezing.     Review of Systems  Constitutional: Positive for chills and fatigue. Negative for fever.  HENT: Negative for congestion, sinus pain, sinus pressure and sore throat.   Respiratory: Positive for cough and chest tightness. Negative for shortness of breath and wheezing.   Cardiovascular: Negative for chest pain, palpitations and leg swelling.  Gastrointestinal: Positive for abdominal pain (gerd much better).  Neurological: Negative for dizziness, tremors and weakness.    Patient Active Problem List   Diagnosis Date Noted  . 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 Avitaminosis D 01/22/2015  . Allergic rhinitis, seasonal 01/22/2015  . Hyperlipidemia 01/22/2015  . 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 Essential (primary) hypertension 01/22/2015  . Gastroesophageal reflux disease with esophagitis 01/22/2015    Prior to Admission medications   Medication Sig Start Date End Date Taking? Authorizing Provider  Azelastine-Fluticasone (DYMISTA) 137-50 MCG/ACT SUSP Place into the nose.   Yes Historical Provider, MD  conjugated estrogens (PREMARIN) vaginal cream Place 1 Applicatorful vaginally daily. 04/23/15  Yes Glean Hess, MD  fexofenadine (ALLEGRA) 180 MG tablet Take by mouth.   Yes Historical Provider, MD  gabapentin (NEURONTIN) 100 MG capsule Take by mouth.   Yes Historical Provider, MD  hydrochlorothiazide (HYDRODIURIL) 12.5 MG tablet  TAKE 1 TABLET BY MOUTH EVERY DAY 06/09/15  Yes Glean Hess, MD  Ibuprofen 200 MG CAPS Take by mouth.   Yes Historical Provider, MD  lansoprazole (PREVACID) 30 MG capsule Take 1 capsule (30 mg total) by mouth 2 (two) times daily before a meal. 10/22/15  Yes Glean Hess, MD  lidocaine (LIDODERM) 5 %  03/30/15  Yes Historical Provider, MD  Multiple Vitamins-Minerals (OCUVITE EXTRA PO) Take by mouth.   Yes Historical Provider, MD  olopatadine (PATANOL) 0.1 % ophthalmic solution Apply to eye.   Yes Historical Provider, MD  ranitidine (ZANTAC) 150 MG tablet Take 1 tablet (150 mg total) by mouth 2 (two) times daily. 03/03/16  Yes Glean Hess, MD  vitamin E 400 UNIT capsule Take 400 Units by mouth daily.   Yes Historical Provider, MD    Allergies  Allergen Reactions  . Celecoxib Anaphylaxis  . Atorvastatin   . Clarithromycin Other (See Comments)  . Codeine Other (See Comments), Nausea Only and Nausea And Vomiting  . Losartan Potassium Other (See Comments)  . Morphine And Related Other (See Comments), Nausea Only and Nausea And Vomiting  . Shellfish Allergy Nausea Only  . Sulfa Antibiotics Nausea And Vomiting  . Mupirocin Rash    Cream/ointment.  . Penicillins Rash    Past Surgical History:  Procedure Laterality Date  . REPLACEMENT TOTAL KNEE BILATERAL Bilateral     Social History  Substance Use Topics  . Smoking status: Never Smoker  . Smokeless tobacco: Never Used  . Alcohol use 1.2 oz/week    2 Standard drinks or equivalent per week     Comment: once weekly  Medication list has been reviewed and updated.   Physical Exam  Constitutional: She is oriented to person, place, and time. She appears well-developed. No distress.  HENT:  Head: Normocephalic and atraumatic.  Cardiovascular: Normal rate, regular rhythm and normal heart sounds.   Pulmonary/Chest: Effort normal. No respiratory distress. She has decreased breath sounds. She has no wheezes.  Bronchial  breath sounds throughout  Musculoskeletal: Normal range of motion.  Neurological: She is alert and oriented to person, place, and time.  Skin: Skin is warm and dry. No rash noted.  Psychiatric: She has a normal mood and affect. Her speech is normal and behavior is normal. Thought content normal.  Nursing note and vitals reviewed.   BP 120/84   Pulse 97   Temp 98.1 F (36.7 C) (Oral)   Resp 16   Ht 5\' 2"  (1.575 m)   Wt 204 lb (92.5 kg)   BMI 37.31 kg/m   Assessment and Plan: 1. Bronchitis Continue tylenol and Mucinex as needed - doxycycline (VIBRA-TABS) 100 MG tablet; Take 1 tablet (100 mg total) by mouth 2 (two) times daily.  Dispense: 20 tablet; Refill: 0 - benzonatate (TESSALON) 100 MG capsule; Take 1 capsule (100 mg total) by mouth 2 (two) times daily as needed for cough.  Dispense: 20 capsule; Refill: 0 - albuterol (PROVENTIL HFA;VENTOLIN HFA) 108 (90 Base) MCG/ACT inhaler; Inhale 2 puffs into the lungs every 6 (six) hours as needed for wheezing or shortness of breath.  Dispense: 1 Inhaler; Refill: 0   Halina Maidens, MD [facility]  03/17/2016

## 2016-03-17 NOTE — Patient Instructions (Signed)
Mucinex-DM twice a day  Tylenol as needed for fever

## 2016-03-19 DIAGNOSIS — C44729 Squamous cell carcinoma of skin of left lower limb, including hip: Secondary | ICD-10-CM | POA: Diagnosis not present

## 2016-03-21 DIAGNOSIS — M7062 Trochanteric bursitis, left hip: Secondary | ICD-10-CM | POA: Diagnosis not present

## 2016-03-21 DIAGNOSIS — M7061 Trochanteric bursitis, right hip: Secondary | ICD-10-CM | POA: Diagnosis not present

## 2016-03-25 ENCOUNTER — Other Ambulatory Visit: Payer: Self-pay | Admitting: Internal Medicine

## 2016-03-25 MED ORDER — CEFUROXIME AXETIL 500 MG PO TABS: 500.0000 mg | ORAL_TABLET | Freq: Two times a day (BID) | ORAL | 0 refills | Status: DC

## 2016-04-14 DIAGNOSIS — H52223 Regular astigmatism, bilateral: Secondary | ICD-10-CM | POA: Diagnosis not present

## 2016-04-14 DIAGNOSIS — H524 Presbyopia: Secondary | ICD-10-CM | POA: Diagnosis not present

## 2016-04-14 DIAGNOSIS — I10 Essential (primary) hypertension: Secondary | ICD-10-CM | POA: Diagnosis not present

## 2016-04-14 DIAGNOSIS — H5203 Hypermetropia, bilateral: Secondary | ICD-10-CM | POA: Diagnosis not present

## 2016-04-14 DIAGNOSIS — H25813 Combined forms of age-related cataract, bilateral: Secondary | ICD-10-CM | POA: Diagnosis not present

## 2016-04-14 DIAGNOSIS — H04123 Dry eye syndrome of bilateral lacrimal glands: Secondary | ICD-10-CM | POA: Diagnosis not present

## 2016-04-14 DIAGNOSIS — H353131 Nonexudative age-related macular degeneration, bilateral, early dry stage: Secondary | ICD-10-CM | POA: Diagnosis not present

## 2016-05-02 DIAGNOSIS — M7061 Trochanteric bursitis, right hip: Secondary | ICD-10-CM | POA: Diagnosis not present

## 2016-05-02 DIAGNOSIS — M7062 Trochanteric bursitis, left hip: Secondary | ICD-10-CM | POA: Diagnosis not present

## 2016-05-08 ENCOUNTER — Encounter: Payer: Self-pay | Admitting: Internal Medicine

## 2016-05-16 DIAGNOSIS — C44729 Squamous cell carcinoma of skin of left lower limb, including hip: Secondary | ICD-10-CM | POA: Diagnosis not present

## 2016-05-16 DIAGNOSIS — D485 Neoplasm of uncertain behavior of skin: Secondary | ICD-10-CM | POA: Diagnosis not present

## 2016-05-16 DIAGNOSIS — C44629 Squamous cell carcinoma of skin of left upper limb, including shoulder: Secondary | ICD-10-CM | POA: Diagnosis not present

## 2016-06-06 DIAGNOSIS — M7061 Trochanteric bursitis, right hip: Secondary | ICD-10-CM | POA: Diagnosis not present

## 2016-06-10 ENCOUNTER — Other Ambulatory Visit: Payer: Self-pay | Admitting: Internal Medicine

## 2016-06-30 ENCOUNTER — Ambulatory Visit (INDEPENDENT_AMBULATORY_CARE_PROVIDER_SITE_OTHER): Payer: Medicare Other | Admitting: Internal Medicine

## 2016-06-30 ENCOUNTER — Encounter: Payer: Self-pay | Admitting: Internal Medicine

## 2016-06-30 VITALS — BP 128/80 | HR 90 | Ht 62.0 in | Wt 204.0 lb

## 2016-06-30 DIAGNOSIS — K2100 Gastro-esophageal reflux disease with esophagitis, without bleeding: Secondary | ICD-10-CM

## 2016-06-30 DIAGNOSIS — N952 Postmenopausal atrophic vaginitis: Secondary | ICD-10-CM | POA: Diagnosis not present

## 2016-06-30 DIAGNOSIS — I10 Essential (primary) hypertension: Secondary | ICD-10-CM

## 2016-06-30 MED ORDER — ESTROGENS, CONJUGATED 0.625 MG/GM VA CREA: 1.0000 | TOPICAL_CREAM | Freq: Every day | VAGINAL | 12 refills | Status: DC

## 2016-06-30 MED ORDER — RANITIDINE HCL 150 MG PO TABS: 150.0000 mg | ORAL_TABLET | Freq: Two times a day (BID) | ORAL | 5 refills | Status: DC

## 2016-06-30 NOTE — Progress Notes (Signed)
Date:  06/30/2016   Name:  Barbara West   DOB:  03-16-1946   MRN:  [id number]   Chief Complaint: Abdominal Pain ("hurts and burns", and has reflux with this. Pt has family hx of hiatal hernia. Pt also Needs premarin cream refilled.) Abdominal Pain  This is a chronic problem. The problem occurs daily. The problem has been waxing and waning. The pain is located in the epigastric region. The pain is mild. She has tried proton pump inhibitors and H2 blockers for the symptoms. Prior diagnostic workup includes upper endoscopy and GI consult.  Hypertension  This is a chronic problem. The current episode started more than 1 year ago. The problem is unchanged. The problem is controlled. Pertinent negatives include no chest pain or shortness of breath.    Review of Systems  Constitutional: Negative for unexpected weight change.  Respiratory: Negative for chest tightness, shortness of breath and wheezing.   Cardiovascular: Negative for chest pain.  Gastrointestinal: Positive for abdominal pain. Negative for blood in stool and rectal pain.  Genitourinary: Positive for vaginal pain.    Patient Active Problem List   Diagnosis Date Noted  . 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 Avitaminosis D 01/22/2015  . Allergic rhinitis, seasonal 01/22/2015  . Hyperlipidemia 01/22/2015  . 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 Essential (primary) hypertension 01/22/2015  . Gastroesophageal reflux disease with esophagitis 01/22/2015    Prior to Admission medications   Medication Sig Start Date End Date Taking? Authorizing Provider  albuterol (PROVENTIL HFA;VENTOLIN HFA) 108 (90 Base) MCG/ACT inhaler Inhale 2 puffs into the lungs every 6 (six) hours as needed for wheezing or shortness of breath. 03/17/16  Yes Glean Hess, MD  Azelastine-Fluticasone [facility]) [phone] MCG/ACT SUSP Place into the nose.   Yes Historical Provider, MD  conjugated estrogens (PREMARIN) vaginal cream Place 1 Applicatorful  vaginally daily. 04/23/15  Yes Glean Hess, MD  fexofenadine (ALLEGRA) 180 MG tablet Take by mouth.   Yes Historical Provider, MD  gabapentin (NEURONTIN) 100 MG capsule Take by mouth.   Yes Historical Provider, MD  hydrochlorothiazide (HYDRODIURIL) 12.5 MG tablet TAKE 1 TABLET BY MOUTH EVERY DAY 06/10/16  Yes Glean Hess, MD  Ibuprofen 200 MG CAPS Take by mouth.   Yes Historical Provider, MD  lansoprazole (PREVACID) 30 MG capsule Take 1 capsule (30 mg total) by mouth 2 (two) times daily before a meal. 10/22/15  Yes Glean Hess, MD  lidocaine (LIDODERM) 5 %  03/30/15  Yes Historical Provider, MD  olopatadine (PATANOL) 0.1 % ophthalmic solution Apply to eye.   Yes Historical Provider, MD  vitamin E 400 UNIT capsule Take 400 Units by mouth daily.   Yes Historical Provider, MD  Multiple Vitamins-Minerals (OCUVITE EXTRA PO) Take by mouth.    Historical Provider, MD  ranitidine (ZANTAC) 150 MG tablet Take 1 tablet (150 mg total) by mouth 2 (two) times daily. Patient not taking: Reported on 06/30/2016 03/03/16   Glean Hess, MD    Allergies  Allergen Reactions  . Celecoxib Anaphylaxis  . Atorvastatin   . Clarithromycin Other (See Comments)  . Codeine Other (See Comments), Nausea Only and Nausea And Vomiting  . Losartan Potassium Other (See Comments)  . Morphine And Related Other (See Comments), Nausea Only and Nausea And Vomiting  . Shellfish Allergy Nausea Only  . Sulfa Antibiotics Nausea And Vomiting  . Mupirocin Rash    Cream/ointment.  . Penicillins Rash    Past Surgical History:  Procedure  Laterality Date  . REPLACEMENT TOTAL KNEE BILATERAL Bilateral     Social History  Substance Use Topics  . Smoking status: Never Smoker  . Smokeless tobacco: Never Used  . Alcohol use 1.2 oz/week    2 Standard drinks or equivalent per week     Comment: once weekly      Medication list has been reviewed and updated.   Physical Exam  Constitutional: She is oriented to  person, place, and time. She appears well-developed. No distress.  HENT:  Head: Normocephalic and atraumatic.  Neck: Normal range of motion.  Cardiovascular: Normal rate, regular rhythm and normal heart sounds.   Pulmonary/Chest: Effort normal and breath sounds normal. No respiratory distress. She has no wheezes.  Abdominal: Soft. Bowel sounds are normal. There is no tenderness. There is no guarding.  Musculoskeletal: Normal range of motion.  Neurological: She is alert and oriented to person, place, and time.  Skin: Skin is warm and dry. No rash noted.  Psychiatric: She has a normal mood and affect. Her speech is normal and behavior is normal. Thought content normal.  Nursing note and vitals reviewed.   BP 128/80   Pulse 90   Ht 5\' 2"  (1.575 m)   Wt 204 lb (92.5 kg)   SpO2 98%   BMI 37.31 kg/m   Assessment and Plan: 1. Gastroesophageal reflux disease with esophagitis Continue PPI, use H2 blocker as needed Continue Gaviscon PRN Avoid high acid foods May need UGI or GI evaluation - ranitidine (ZANTAC) 150 MG tablet; Take 1 tablet (150 mg total) by mouth 2 (two) times daily.  Dispense: 60 tablet; Refill: 5  2. Essential (primary) hypertension controlled  3. Atrophic vaginitis Responding to estrogen   Meds ordered this encounter  Medications  . conjugated estrogens (PREMARIN) vaginal cream    Sig: Place 1 Applicatorful vaginally daily.    Dispense:  42.5 g    Refill:  12  . ranitidine (ZANTAC) 150 MG tablet    Sig: Take 1 tablet (150 mg total) by mouth 2 (two) times daily.    Dispense:  60 tablet    Refill:  [place], MD [facility]  06/30/2016

## 2016-07-07 DIAGNOSIS — L570 Actinic keratosis: Secondary | ICD-10-CM | POA: Diagnosis not present

## 2016-07-07 DIAGNOSIS — Z85828 Personal history of other malignant neoplasm of skin: Secondary | ICD-10-CM | POA: Diagnosis not present

## 2016-07-18 DIAGNOSIS — M7061 Trochanteric bursitis, right hip: Secondary | ICD-10-CM | POA: Diagnosis not present

## 2016-07-18 DIAGNOSIS — M7062 Trochanteric bursitis, left hip: Secondary | ICD-10-CM | POA: Diagnosis not present

## 2016-07-18 DIAGNOSIS — H43811 Vitreous degeneration, right eye: Secondary | ICD-10-CM | POA: Diagnosis not present

## 2016-07-28 DIAGNOSIS — M064 Inflammatory polyarthropathy: Secondary | ICD-10-CM | POA: Diagnosis not present

## 2016-07-28 DIAGNOSIS — M353 Polymyalgia rheumatica: Secondary | ICD-10-CM | POA: Diagnosis not present

## 2016-08-28 DIAGNOSIS — M7061 Trochanteric bursitis, right hip: Secondary | ICD-10-CM | POA: Diagnosis not present

## 2016-08-28 DIAGNOSIS — M7062 Trochanteric bursitis, left hip: Secondary | ICD-10-CM | POA: Diagnosis not present

## 2016-09-05 ENCOUNTER — Ambulatory Visit (INDEPENDENT_AMBULATORY_CARE_PROVIDER_SITE_OTHER): Payer: Medicare Other | Admitting: Internal Medicine

## 2016-09-05 ENCOUNTER — Encounter: Payer: Self-pay | Admitting: Internal Medicine

## 2016-09-05 VITALS — BP 124/84 | HR 64 | Ht 62.0 in | Wt 201.2 lb

## 2016-09-05 DIAGNOSIS — M7061 Trochanteric bursitis, right hip: Secondary | ICD-10-CM | POA: Insufficient documentation

## 2016-09-05 DIAGNOSIS — M353 Polymyalgia rheumatica: Secondary | ICD-10-CM | POA: Diagnosis not present

## 2016-09-05 DIAGNOSIS — Z0000 Encounter for general adult medical examination without abnormal findings: Secondary | ICD-10-CM | POA: Diagnosis not present

## 2016-09-05 DIAGNOSIS — I10 Essential (primary) hypertension: Secondary | ICD-10-CM | POA: Diagnosis not present

## 2016-09-05 DIAGNOSIS — M7062 Trochanteric bursitis, left hip: Secondary | ICD-10-CM

## 2016-09-05 DIAGNOSIS — K2100 Gastro-esophageal reflux disease with esophagitis, without bleeding: Secondary | ICD-10-CM

## 2016-09-05 DIAGNOSIS — Z1239 Encounter for other screening for malignant neoplasm of breast: Secondary | ICD-10-CM

## 2016-09-05 DIAGNOSIS — E782 Mixed hyperlipidemia: Secondary | ICD-10-CM

## 2016-09-05 DIAGNOSIS — Z1231 Encounter for screening mammogram for malignant neoplasm of breast: Secondary | ICD-10-CM

## 2016-09-05 HISTORY — DX: Polymyalgia rheumatica: M35.3

## 2016-09-05 LAB — POCT URINALYSIS DIPSTICK
Bilirubin, UA: NEGATIVE
Blood, UA: NEGATIVE
Glucose, UA: NEGATIVE
KETONES UA: NEGATIVE
LEUKOCYTES UA: NEGATIVE
Nitrite, UA: NEGATIVE
PH UA: 6 (ref 5.0–8.0)
PROTEIN UA: NEGATIVE
SPEC GRAV UA: 1.01 (ref 1.010–1.025)
UROBILINOGEN UA: 0.2 U/dL

## 2016-09-05 MED ORDER — LANSOPRAZOLE 30 MG PO CPDR: 30.0000 mg | DELAYED_RELEASE_CAPSULE | Freq: Two times a day (BID) | ORAL | 3 refills | Status: DC

## 2016-09-05 MED ORDER — HYDROCHLOROTHIAZIDE 12.5 MG PO TABS: 12.5000 mg | ORAL_TABLET | Freq: Every day | ORAL | 3 refills | Status: DC

## 2016-09-05 NOTE — Progress Notes (Signed)
Patient: Barbara West, Female    DOB: [birth year], 71 y.o.   MRN: [id number] Visit Date: 09/05/2016  Today's Provider: Halina Maidens, MD   Chief Complaint  Patient presents with  . Medicare Wellness    Breast exam.    Subjective:    Annual wellness visit Barbara West is a 71 y.o. female who presents today for her Subsequent Annual Wellness Visit. She feels well. She reports exercising at . She reports she is sleeping well.   ----------------------------------------------------------- Hypertension  This is a chronic problem. The problem is controlled. Pertinent negatives include no chest pain, headaches, palpitations or shortness of breath. Past treatments include diuretics. There are no compliance problems.   Gastroesophageal Reflux  She complains of heartburn. She reports no abdominal pain, no chest pain, no coughing or no wheezing. This is a recurrent problem. The problem occurs frequently. The heartburn is of moderate intensity. The symptoms are aggravated by medications (on a very slow taper of prednisone). Pertinent negatives include no fatigue. She has tried a PPI and a histamine-2 antagonist for the symptoms. The treatment provided significant relief.  Hyperlipidemia  This is a chronic problem. Pertinent negatives include no chest pain or shortness of breath. She is currently on no antihyperlipidemic treatment.  PMR - being treated by Rheum at [facility] with very slow prednisone taper.  She is feeling better on this therapy. Trochanteric bursitis - seen by [facility] and getting injections as needed.  Not taking any regular doses of anti-inflammatories  Review of Systems  Constitutional: Negative for chills, fatigue and fever.  HENT: Negative for congestion, hearing loss, tinnitus, trouble swallowing and voice change.   Eyes: Negative for visual disturbance.  Respiratory: Negative for cough, chest tightness, shortness of breath and wheezing.   Cardiovascular: Negative for chest  pain, palpitations and leg swelling.  Gastrointestinal: Positive for heartburn. Negative for abdominal pain, constipation, diarrhea and vomiting.  Endocrine: Negative for polydipsia and polyuria.  Genitourinary: Negative for dysuria, frequency, genital sores, vaginal bleeding and vaginal discharge.  Musculoskeletal: Positive for arthralgias and gait problem (walks with a cane). Negative for joint swelling.  Skin: Positive for color change (from bruising and steroids). Negative for rash.  Neurological: Negative for dizziness, tremors, light-headedness and headaches.  Hematological: Negative for adenopathy. Bruises/bleeds easily.  Psychiatric/Behavioral: Negative for dysphoric mood and sleep disturbance. The patient is not nervous/anxious.     Social History   Social History  . Marital status: Married    Spouse name: N/A  . Number of children: N/A  . Years of education: N/A   Occupational History  . Not on file.   Social History Main Topics  . Smoking status: Never Smoker  . Smokeless tobacco: Never Used  . Alcohol use 1.2 oz/week    2 Standard drinks or equivalent per week     Comment: once weekly   . Drug use: No  . Sexual activity: Not on file   Other Topics Concern  . Not on file   Social History Narrative  . No narrative on file    Patient Active Problem List   Diagnosis Date Noted  . 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 Avitaminosis D 01/22/2015  . Allergic rhinitis, seasonal 01/22/2015  . Hyperlipidemia 01/22/2015  . 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 Essential (primary) hypertension 01/22/2015  . Gastroesophageal reflux disease with esophagitis 01/22/2015    Past Surgical History:  Procedure Laterality Date  . REPLACEMENT TOTAL KNEE BILATERAL Bilateral     Her family  history includes Alzheimer's disease in her mother.     Previous Medications   ALBUTEROL (PROVENTIL HFA;VENTOLIN HFA) 108 (90 BASE) MCG/ACT INHALER    Inhale 2 puffs into the lungs  every 6 (six) hours as needed for wheezing or shortness of breath.   AZELASTINE-FLUTICASONE (DYMISTA) 137-50 MCG/ACT SUSP    Place into the nose.   CHOLECALCIFEROL (VITAMIN D) 400 UNITS TABS TABLET    Take 400 Units by mouth.   CONJUGATED ESTROGENS (PREMARIN) VAGINAL CREAM    Place 1 Applicatorful vaginally daily.   FEXOFENADINE (ALLEGRA) 180 MG TABLET    Take by mouth.   GABAPENTIN (NEURONTIN) 100 MG CAPSULE    Take by mouth.   HYDROCHLOROTHIAZIDE (HYDRODIURIL) 12.5 MG TABLET    TAKE 1 TABLET BY MOUTH EVERY DAY   IBUPROFEN 200 MG CAPS    Take by mouth.   LANSOPRAZOLE (PREVACID) 30 MG CAPSULE    Take 1 capsule (30 mg total) by mouth 2 (two) times daily before a meal.   LIDOCAINE (LIDODERM) 5 %       MULTIPLE VITAMINS-MINERALS (OCUVITE EYE HEALTH FORMULA PO)    Take by mouth.   OLOPATADINE (PATANOL) 0.1 % OPHTHALMIC SOLUTION    Apply to eye.   PREDNISONE (DELTASONE) 5 MG TABLET    Take 5 mg by mouth daily with breakfast.   RANITIDINE (ZANTAC) 150 MG TABLET    Take 1 tablet (150 mg total) by mouth 2 (two) times daily.    Patient Care Team: Glean Hess, MD as PCP - General (Internal Medicine)      Objective:   Vitals: BP 124/84 (BP Location: Left Arm, Patient Position: Sitting, Cuff Size: Large)   Pulse 64   Ht 5' 2" (1.575 m)   Wt 201 lb 3.2 oz (91.3 kg)   SpO2 96%   BMI 36.80 kg/m   Physical Exam  Constitutional: She is oriented to person, place, and time. She appears well-developed and well-nourished. No distress.  HENT:  Head: Normocephalic and atraumatic.  Right Ear: Tympanic membrane and ear canal normal.  Left Ear: Tympanic membrane and ear canal normal.  Nose: Right sinus exhibits no maxillary sinus tenderness. Left sinus exhibits no maxillary sinus tenderness.  Mouth/Throat: Uvula is midline and oropharynx is clear and moist.  Eyes: Conjunctivae and EOM are normal. Right eye exhibits no discharge. Left eye exhibits no discharge. No scleral icterus.  Neck: Normal  range of motion. Carotid bruit is not present. No erythema present. No thyromegaly present.  Cardiovascular: Normal rate, regular rhythm, normal heart sounds and normal pulses.   Pulmonary/Chest: Effort normal. No respiratory distress. She has no wheezes. Right breast exhibits no mass, no nipple discharge, no skin change and no tenderness. Left breast exhibits no mass, no nipple discharge, no skin change and no tenderness.  Abdominal: Soft. Bowel sounds are normal. There is no hepatosplenomegaly. There is no tenderness. There is no CVA tenderness.  Lymphadenopathy:    She has no cervical adenopathy.    She has no axillary adenopathy.  Neurological: She is alert and oriented to person, place, and time. She has normal reflexes. No cranial nerve deficit or sensory deficit.  Skin: Skin is warm, dry and intact. No rash noted.  Senile ecchymoses on arms Erythema ab igne on back  Psychiatric: She has a normal mood and affect. Her speech is normal and behavior is normal. Judgment and thought content normal. Cognition and memory are normal.  Nursing note and vitals reviewed.   Activities of Daily  Living In your present state of health, do you have any difficulty performing the following activities: 09/05/2016 10/22/2015  Hearing? N N  Vision? N N  Difficulty concentrating or making decisions? N N  Walking or climbing stairs? N N  Dressing or bathing? N N  Doing errands, shopping? N N  Preparing Food and eating ? N -  Using the Toilet? N -  In the past six months, have you accidently leaked urine? N -  Do you have problems with loss of bowel control? N -  Managing your Medications? N -  Managing your Finances? N -  Housekeeping or managing your Housekeeping? N -  Some recent data might be hidden    Fall Risk Assessment Fall Risk  09/05/2016 10/22/2015 01/22/2015  Falls in the past year? No No No      Depression Screen PHQ 2/9 Scores 09/05/2016 10/22/2015 01/22/2015  PHQ - 2 Score 0 0 0    6CIT  Screen 09/05/2016  What Year? 0 points  What month? 0 points  What time? 0 points  Count back from 20 0 points  Months in reverse 0 points  Repeat phrase 0 points  Total Score 0     Medicare Annual Wellness Visit Summary:  Reviewed patient's Family Medical History Reviewed and updated list of patient's medical providers Assessment of cognitive impairment was done Assessed patient's functional ability Established a written schedule for health screening [place] Completed and Reviewed  Exercise Activities and Dietary recommendations Goals    . Decrease the likelihood of falling          Plans to go to [facility] physical therapy/exercise programs to help with balance and endurance   Goal Met and she is in medical wellness program.       Immunization History  Administered Date(s) Administered  . Influenza-Unspecified 03/06/2015, 02/06/2016  . Pneumococcal Conjugate-13 02/20/2014  . Pneumococcal Polysaccharide-23 04/23/2015    Health Maintenance  Topic Date Due  . TETANUS/TDAP  08/14/1964  . INFLUENZA VACCINE  12/03/2016  . MAMMOGRAM  08/23/2017  . COLONOSCOPY  05/06/2018  . DEXA SCAN  Completed  . Hepatitis C Screening  Completed  . PNA vac Low Risk Adult  Completed    Discussed health benefits of physical activity, and encouraged her to engage in regular exercise appropriate for her age and condition.    ------------------------------------------------------------------------------------------------------------  Assessment & Plan:    1. Medicare annual wellness visit, subsequent Measures satisfied - POCT urinalysis dipstick  2. Breast cancer screening - MM DIGITAL SCREENING BILATERAL  3. Essential (primary) hypertension controlled - TSH - Comprehensive metabolic panel  4. Gastroesophageal reflux disease with esophagitis Continue PPI and PRN H2 blocker - lansoprazole (PREVACID) 30 MG capsule; Take 1 capsule (30 mg total) by mouth 2 (two)  times daily before a meal.  Dispense: 180 capsule; Refill: 3 - CBC with Differential/Platelet  5. Mixed hyperlipidemia Will advise if we need to try medication - Lipid panel  6. PMR (polymyalgia rheumatica) (HCC) Continue slow prednisone taper  7. Trochanteric bursitis of both hips Followed by [facility]   Meds ordered this encounter  Medications  . lansoprazole (PREVACID) 30 MG capsule    Sig: Take 1 capsule (30 mg total) by mouth 2 (two) times daily before a meal.    Dispense:  180 capsule    Refill:  3  . hydrochlorothiazide (HYDRODIURIL) 12.5 MG tablet    Sig: Take 1 tablet (12.5 mg total) by mouth daily.  Dispense:  90 tablet    Refill:  [place], MD [facility]  09/05/2016

## 2016-09-05 NOTE — Patient Instructions (Signed)
Health Maintenance  Topic Date Due  . TETANUS/TDAP  08/14/1964  . INFLUENZA VACCINE  12/03/2016  . MAMMOGRAM  08/23/2017  . COLONOSCOPY  05/06/2018  . DEXA SCAN  Completed  . Hepatitis C Screening  Completed  . PNA vac Low Risk Adult  Completed

## 2016-09-06 LAB — COMPREHENSIVE METABOLIC PANEL
A/G RATIO: 1.7 (ref 1.2–2.2)
ALBUMIN: 4.3 g/dL (ref 3.5–4.8)
ALT: 9 IU/L (ref 0–32)
AST: 14 IU/L (ref 0–40)
Alkaline Phosphatase: 96 IU/L (ref 39–117)
BUN / CREAT RATIO: 16 (ref 12–28)
BUN: 15 mg/dL (ref 8–27)
Bilirubin Total: 0.3 mg/dL (ref 0.0–1.2)
CALCIUM: 9.8 mg/dL (ref 8.7–10.3)
CHLORIDE: 99 mmol/L (ref 96–106)
CO2: 24 mmol/L (ref 18–29)
Creatinine, Ser: 0.95 mg/dL (ref 0.57–1.00)
GFR, EST AFRICAN AMERICAN: 70 mL/min/{1.73_m2} (ref >59)
GFR, EST NON AFRICAN AMERICAN: 60 mL/min/{1.73_m2} (ref >59)
GLOBULIN, TOTAL: 2.6 g/dL (ref 1.5–4.5)
Glucose: 93 mg/dL (ref 65–99)
POTASSIUM: 4 mmol/L (ref 3.5–5.2)
Sodium: 141 mmol/L (ref 134–144)
TOTAL PROTEIN: 6.9 g/dL (ref 6.0–8.5)

## 2016-09-06 LAB — LIPID PANEL
CHOLESTEROL TOTAL: 318 mg/dL — AB (ref 100–199)
Chol/HDL Ratio: 3.9 ratio (ref 0.0–4.4)
HDL: 81 mg/dL (ref >39)
LDL Calculated: 206 mg/dL — ABNORMAL HIGH (ref 0–99)
TRIGLYCERIDES: 155 mg/dL — AB (ref 0–149)
VLDL Cholesterol Cal: 31 mg/dL (ref 5–40)

## 2016-09-06 LAB — CBC WITH DIFFERENTIAL/PLATELET
BASOS ABS: 0.1 10*3/uL (ref 0.0–0.2)
BASOS: 0 %
EOS (ABSOLUTE): 0.1 10*3/uL (ref 0.0–0.4)
Eos: 1 %
HEMOGLOBIN: 14.7 g/dL (ref 11.1–15.9)
Hematocrit: 43.1 % (ref 34.0–46.6)
IMMATURE GRANS (ABS): 0.1 10*3/uL (ref 0.0–0.1)
Immature Granulocytes: 1 %
LYMPHS ABS: 2.7 10*3/uL (ref 0.7–3.1)
Lymphs: 22 %
MCH: 30.2 pg (ref 26.6–33.0)
MCHC: 34.1 g/dL (ref 31.5–35.7)
MCV: 89 fL (ref 79–97)
Monocytes Absolute: 1.3 10*3/uL — ABNORMAL HIGH (ref 0.1–0.9)
Monocytes: 11 %
NEUTROS ABS: 8.3 10*3/uL — AB (ref 1.4–7.0)
Neutrophils: 65 %
PLATELETS: 379 10*3/uL (ref 150–379)
RBC: 4.87 x10E6/uL (ref 3.77–5.28)
RDW: 14.2 % (ref 12.3–15.4)
WBC: 12.6 10*3/uL — ABNORMAL HIGH (ref 3.4–10.8)

## 2016-09-06 LAB — TSH: TSH: 1.52 u[IU]/mL (ref 0.450–4.500)

## 2016-09-07 ENCOUNTER — Other Ambulatory Visit: Payer: Self-pay | Admitting: Internal Medicine

## 2016-09-15 DIAGNOSIS — H43811 Vitreous degeneration, right eye: Secondary | ICD-10-CM | POA: Diagnosis not present

## 2016-10-10 DIAGNOSIS — M7061 Trochanteric bursitis, right hip: Secondary | ICD-10-CM | POA: Diagnosis not present

## 2016-10-10 DIAGNOSIS — M7062 Trochanteric bursitis, left hip: Secondary | ICD-10-CM | POA: Diagnosis not present

## 2016-11-21 DIAGNOSIS — M7061 Trochanteric bursitis, right hip: Secondary | ICD-10-CM | POA: Diagnosis not present

## 2016-11-21 DIAGNOSIS — M545 Low back pain: Secondary | ICD-10-CM | POA: Diagnosis not present

## 2016-11-21 DIAGNOSIS — M064 Inflammatory polyarthropathy: Secondary | ICD-10-CM | POA: Diagnosis not present

## 2016-11-21 DIAGNOSIS — M7062 Trochanteric bursitis, left hip: Secondary | ICD-10-CM | POA: Diagnosis not present

## 2016-11-21 DIAGNOSIS — M353 Polymyalgia rheumatica: Secondary | ICD-10-CM | POA: Diagnosis not present

## 2016-12-08 ENCOUNTER — Telehealth: Payer: Self-pay

## 2016-12-08 ENCOUNTER — Other Ambulatory Visit: Payer: Self-pay | Admitting: Internal Medicine

## 2016-12-08 MED ORDER — ONDANSETRON HCL 4 MG PO TABS: 4.0000 mg | ORAL_TABLET | Freq: Three times a day (TID) | ORAL | 0 refills | Status: DC | PRN

## 2016-12-08 NOTE — Telephone Encounter (Signed)
Pt informed of zofran sent in-

## 2016-12-08 NOTE — Telephone Encounter (Signed)
Zofran sent to [facility]

## 2016-12-08 NOTE — Telephone Encounter (Signed)
Pt called stating she is still being tapered of prednisone. They lower down one mg every month. Every time this happens she has nausea to the point that she wants to vomit but does not actually do it. Wants to know if something can be called in to help with her nausea while still being tapered. She states she is not having any acid reflux. Please Advise.

## 2016-12-15 ENCOUNTER — Encounter: Payer: Self-pay | Admitting: Internal Medicine

## 2016-12-15 ENCOUNTER — Ambulatory Visit (INDEPENDENT_AMBULATORY_CARE_PROVIDER_SITE_OTHER): Payer: Medicare Other | Admitting: Internal Medicine

## 2016-12-15 ENCOUNTER — Other Ambulatory Visit: Payer: Self-pay | Admitting: Internal Medicine

## 2016-12-15 VITALS — BP 114/70 | HR 89 | Ht 62.0 in | Wt 197.4 lb

## 2016-12-15 DIAGNOSIS — I10 Essential (primary) hypertension: Secondary | ICD-10-CM | POA: Diagnosis not present

## 2016-12-15 DIAGNOSIS — M353 Polymyalgia rheumatica: Secondary | ICD-10-CM | POA: Diagnosis not present

## 2016-12-15 DIAGNOSIS — K2100 Gastro-esophageal reflux disease with esophagitis, without bleeding: Secondary | ICD-10-CM

## 2016-12-15 DIAGNOSIS — R262 Difficulty in walking, not elsewhere classified: Secondary | ICD-10-CM | POA: Insufficient documentation

## 2016-12-15 MED ORDER — PROMETHAZINE HCL 12.5 MG PO TABS: 12.5000 mg | ORAL_TABLET | Freq: Three times a day (TID) | ORAL | 0 refills | Status: DC | PRN

## 2016-12-15 NOTE — Progress Notes (Signed)
Date:  12/15/2016   Name:  Barbara West   DOB:  [birth year]   MRN:  [id number]   Chief Complaint: Nausea (Was seen by rheumotologist. Ortho said they know more about prednisone. They decided to taper her off of prednisone. Nausea is becoming worse- wants to discuss this. Started tapering to 4 mg in April. Now taking 0.5 mg. Next month will be takign 0.25 mg to a 0.5 every other day. )  PMR - being tapered off of prednisone by Rheumatology.  HPI Nausea -started in April when she started to taper the prednisone.  Each time she reduced the dose she had nausea for several days then resolved until the next dose reduction.  This time, now on 0.5 mg and the nausea has been constant. She is on dual medications - PPI and H2blocker.  She has had an EGD ~2010 which showed only hiatal hernia. Her GI was Dr. Cletis Media who retired.  Review of Systems  Constitutional: Positive for appetite change (decreased due to nausea). Negative for chills, fatigue and unexpected weight change.  HENT: Positive for trouble swallowing (feels food sticking at time in mid esophagus).   Respiratory: Negative for chest tightness, shortness of breath and wheezing.   Cardiovascular: Negative for chest pain and palpitations.  Gastrointestinal: Positive for nausea. Negative for abdominal pain, blood in stool, diarrhea and vomiting.    Patient Active Problem List   Diagnosis Date Noted  . Disability of walking 12/15/2016  . 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 PMR (polymyalgia rheumatica) (HCC) 09/05/2016  . Trochanteric bursitis of both hips 09/05/2016  . 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 Avitaminosis D 01/22/2015  . Allergic rhinitis, seasonal 01/22/2015  . Hyperlipidemia 01/22/2015  . 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 Essential (primary) hypertension 01/22/2015  . Gastroesophageal reflux disease with esophagitis 01/22/2015    Prior to Admission medications   Medication Sig Start  Date End Date Taking? Authorizing Provider  albuterol (PROVENTIL HFA;VENTOLIN HFA) 108 (90 Base) MCG/ACT inhaler Inhale 2 puffs into the lungs every 6 (six) hours as needed for wheezing or shortness of breath. 03/17/16  Yes Glean Hess, MD  Azelastine-Fluticasone [facility]) 137-50 MCG/ACT SUSP Place into the nose.   Yes [provider]  cholecalciferol (VITAMIN D) 400 units TABS tablet Take 400 Units by mouth.   Yes [provider]  conjugated estrogens (PREMARIN) vaginal cream Place 1 Applicatorful vaginally daily. 06/30/16  Yes Glean Hess, MD  fexofenadine (ALLEGRA) 180 MG tablet Take by mouth.   Yes [provider]  gabapentin (NEURONTIN) 100 MG capsule Take by mouth.   Yes [provider]  hydrochlorothiazide (HYDRODIURIL) 12.5 MG tablet TAKE 1 TABLET BY MOUTH EVERY DAY 09/07/16  Yes Glean Hess, MD  Ibuprofen 200 MG CAPS Take by mouth.   Yes [provider]  lansoprazole (PREVACID) 30 MG capsule Take 1 capsule (30 mg total) by mouth 2 (two) times daily before a meal. 09/05/16  Yes Glean Hess, MD  lidocaine (LIDODERM) 5 %  03/30/15  Yes [provider]  olopatadine (PATANOL) 0.1 % ophthalmic solution Apply to eye.   Yes [provider]  ondansetron (ZOFRAN) 4 MG tablet Take 1 tablet (4 mg total) by mouth every 8 (eight) hours as needed for nausea or vomiting. 12/08/16  Yes Glean Hess, MD  predniSONE (DELTASONE) 5 MG tablet Take 0.5 mg by mouth daily with breakfast.    Yes [provider]  ranitidine (  ZANTAC) 150 MG tablet Take 1 tablet (150 mg total) by mouth 2 (two) times daily. 06/30/16  Yes Glean Hess, MD    Allergies  Allergen Reactions  . Celecoxib Anaphylaxis  . Atorvastatin   . Clarithromycin Other (See Comments)  . Codeine Other (See Comments), Nausea Only and Nausea And Vomiting  . Losartan Potassium Other (See Comments)  . Morphine And Related Other (See Comments), Nausea Only  and Nausea And Vomiting  . Shellfish Allergy Nausea Only  . Sulfa Antibiotics Nausea And Vomiting  . Mupirocin Rash    Cream/ointment.  . Penicillins Rash    Past Surgical History:  Procedure Laterality Date  . REPLACEMENT TOTAL KNEE BILATERAL Bilateral     Social History  Substance Use Topics  . Smoking status: Never Smoker  . Smokeless tobacco: Never Used  . Alcohol use 1.2 oz/week    2 Standard drinks or equivalent per week     Comment: once weekly      Medication list has been reviewed and updated.   Physical Exam  Constitutional: She is oriented to person, place, and time. She appears well-developed. No distress.  HENT:  Head: Normocephalic and atraumatic.  Cardiovascular: Normal rate, regular rhythm and normal heart sounds.   Pulmonary/Chest: Effort normal and breath sounds normal. No respiratory distress. She has no wheezes.  Abdominal: Soft. Normal appearance and bowel sounds are normal. There is no hepatosplenomegaly. There is tenderness in the epigastric area. There is no rigidity, no guarding and no CVA tenderness.  Musculoskeletal: Normal range of motion.  Neurological: She is alert and oriented to person, place, and time.  Skin: Skin is warm and dry. No rash noted.  Psychiatric: She has a normal mood and affect. Her behavior is normal. Thought content normal.  Nursing note and vitals reviewed.   BP 114/70   Pulse 89   Ht 5\' 2"  (1.575 m)   Wt 197 lb 6.4 oz (89.5 kg)   SpO2 97%   BMI 36.10 kg/m   Assessment and Plan: 1. Gastroesophageal reflux disease with esophagitis Check H Pylori and get barium swallow - H. pylori antibody, IgG - Basic metabolic panel - DG Esophagus - promethazine (PHENERGAN) 12.5 MG tablet; Take 1 tablet (12.5 mg total) by mouth every 8 (eight) hours as needed for nausea or vomiting.  Dispense: 20 tablet; Refill: 0  2. Essential (primary) hypertension controlled  3. PMR (polymyalgia rheumatica) (HCC) Followed by  Rheumatology   Meds ordered this encounter  Medications  . promethazine (PHENERGAN) 12.5 MG tablet    Sig: Take 1 tablet (12.5 mg total) by mouth every 8 (eight) hours as needed for nausea or vomiting.    Dispense:  20 tablet    Refill:  0    Halina Maidens, MD [facility]  12/15/2016

## 2016-12-16 LAB — BASIC METABOLIC PANEL
BUN / CREAT RATIO: 9 — AB (ref 12–28)
BUN: 8 mg/dL (ref 8–27)
CHLORIDE: 96 mmol/L (ref 96–106)
CO2: 25 mmol/L (ref 20–29)
Calcium: 9.8 mg/dL (ref 8.7–10.3)
Creatinine, Ser: 0.9 mg/dL (ref 0.57–1.00)
GFR calc Af Amer: 74 mL/min/{1.73_m2} (ref >59)
GFR calc non Af Amer: 65 mL/min/{1.73_m2} (ref >59)
Glucose: 79 mg/dL (ref 65–99)
POTASSIUM: 4.1 mmol/L (ref 3.5–5.2)
Sodium: 138 mmol/L (ref 134–144)

## 2016-12-16 LAB — H. PYLORI ANTIBODY, IGG

## 2016-12-17 DIAGNOSIS — K224 Dyskinesia of esophagus: Secondary | ICD-10-CM | POA: Diagnosis not present

## 2016-12-17 DIAGNOSIS — K449 Diaphragmatic hernia without obstruction or gangrene: Secondary | ICD-10-CM | POA: Diagnosis not present

## 2016-12-18 ENCOUNTER — Encounter: Payer: Self-pay | Admitting: Internal Medicine

## 2016-12-19 ENCOUNTER — Other Ambulatory Visit: Payer: Self-pay | Admitting: Internal Medicine

## 2016-12-19 DIAGNOSIS — K224 Dyskinesia of esophagus: Secondary | ICD-10-CM

## 2016-12-19 DIAGNOSIS — K2100 Gastro-esophageal reflux disease with esophagitis, without bleeding: Secondary | ICD-10-CM

## 2016-12-19 NOTE — Telephone Encounter (Signed)
Patient MyChart msg.

## 2016-12-19 NOTE — Progress Notes (Signed)
Informed pt of Dr Army Melia note on Barium Swallow. Pt wants to go see Dr Charlestine Night. Please Advise.

## 2016-12-24 ENCOUNTER — Other Ambulatory Visit: Payer: Self-pay | Admitting: Internal Medicine

## 2016-12-24 MED ORDER — FLUCONAZOLE 100 MG PO TABS: 100.0000 mg | ORAL_TABLET | Freq: Every day | ORAL | 0 refills | Status: DC

## 2016-12-31 DIAGNOSIS — M7062 Trochanteric bursitis, left hip: Secondary | ICD-10-CM | POA: Diagnosis not present

## 2016-12-31 DIAGNOSIS — M7061 Trochanteric bursitis, right hip: Secondary | ICD-10-CM | POA: Diagnosis not present

## 2017-01-01 ENCOUNTER — Encounter: Payer: Self-pay | Admitting: Internal Medicine

## 2017-01-12 ENCOUNTER — Other Ambulatory Visit: Payer: Self-pay | Admitting: Internal Medicine

## 2017-01-19 DIAGNOSIS — H43811 Vitreous degeneration, right eye: Secondary | ICD-10-CM | POA: Diagnosis not present

## 2017-01-19 DIAGNOSIS — H25813 Combined forms of age-related cataract, bilateral: Secondary | ICD-10-CM | POA: Diagnosis not present

## 2017-01-19 DIAGNOSIS — H353131 Nonexudative age-related macular degeneration, bilateral, early dry stage: Secondary | ICD-10-CM | POA: Diagnosis not present

## 2017-02-02 DIAGNOSIS — R0789 Other chest pain: Secondary | ICD-10-CM | POA: Diagnosis not present

## 2017-02-04 DIAGNOSIS — R1314 Dysphagia, pharyngoesophageal phase: Secondary | ICD-10-CM | POA: Diagnosis not present

## 2017-02-04 DIAGNOSIS — K209 Esophagitis, unspecified: Secondary | ICD-10-CM | POA: Diagnosis not present

## 2017-02-04 DIAGNOSIS — K317 Polyp of stomach and duodenum: Secondary | ICD-10-CM | POA: Diagnosis not present

## 2017-02-04 DIAGNOSIS — K219 Gastro-esophageal reflux disease without esophagitis: Secondary | ICD-10-CM | POA: Diagnosis not present

## 2017-02-10 DIAGNOSIS — M7061 Trochanteric bursitis, right hip: Secondary | ICD-10-CM | POA: Diagnosis not present

## 2017-02-10 DIAGNOSIS — M7062 Trochanteric bursitis, left hip: Secondary | ICD-10-CM | POA: Diagnosis not present

## 2017-02-13 DIAGNOSIS — Z23 Encounter for immunization: Secondary | ICD-10-CM | POA: Diagnosis not present

## 2017-02-16 DIAGNOSIS — H25043 Posterior subcapsular polar age-related cataract, bilateral: Secondary | ICD-10-CM | POA: Diagnosis not present

## 2017-02-16 DIAGNOSIS — H2513 Age-related nuclear cataract, bilateral: Secondary | ICD-10-CM | POA: Diagnosis not present

## 2017-02-19 DIAGNOSIS — M353 Polymyalgia rheumatica: Secondary | ICD-10-CM | POA: Diagnosis not present

## 2017-02-19 DIAGNOSIS — M064 Inflammatory polyarthropathy: Secondary | ICD-10-CM | POA: Diagnosis not present

## 2017-02-26 DIAGNOSIS — Z1231 Encounter for screening mammogram for malignant neoplasm of breast: Secondary | ICD-10-CM | POA: Diagnosis not present

## 2017-03-02 DIAGNOSIS — K219 Gastro-esophageal reflux disease without esophagitis: Secondary | ICD-10-CM | POA: Diagnosis not present

## 2017-03-04 DIAGNOSIS — D1801 Hemangioma of skin and subcutaneous tissue: Secondary | ICD-10-CM | POA: Diagnosis not present

## 2017-03-04 DIAGNOSIS — L570 Actinic keratosis: Secondary | ICD-10-CM | POA: Diagnosis not present

## 2017-03-04 DIAGNOSIS — L821 Other seborrheic keratosis: Secondary | ICD-10-CM | POA: Diagnosis not present

## 2017-03-04 DIAGNOSIS — L820 Inflamed seborrheic keratosis: Secondary | ICD-10-CM | POA: Diagnosis not present

## 2017-03-04 DIAGNOSIS — D485 Neoplasm of uncertain behavior of skin: Secondary | ICD-10-CM | POA: Diagnosis not present

## 2017-03-04 DIAGNOSIS — C44729 Squamous cell carcinoma of skin of left lower limb, including hip: Secondary | ICD-10-CM | POA: Diagnosis not present

## 2017-03-20 DIAGNOSIS — M7061 Trochanteric bursitis, right hip: Secondary | ICD-10-CM | POA: Diagnosis not present

## 2017-03-20 DIAGNOSIS — M7062 Trochanteric bursitis, left hip: Secondary | ICD-10-CM | POA: Diagnosis not present

## 2017-04-02 DIAGNOSIS — C44729 Squamous cell carcinoma of skin of left lower limb, including hip: Secondary | ICD-10-CM | POA: Diagnosis not present

## 2017-04-22 DIAGNOSIS — M7061 Trochanteric bursitis, right hip: Secondary | ICD-10-CM | POA: Diagnosis not present

## 2017-04-22 DIAGNOSIS — M7062 Trochanteric bursitis, left hip: Secondary | ICD-10-CM | POA: Diagnosis not present

## 2017-04-30 DIAGNOSIS — H25812 Combined forms of age-related cataract, left eye: Secondary | ICD-10-CM | POA: Diagnosis not present

## 2017-04-30 DIAGNOSIS — H52222 Regular astigmatism, left eye: Secondary | ICD-10-CM | POA: Diagnosis not present

## 2017-09-10 ENCOUNTER — Ambulatory Visit: Payer: Self-pay

## 2017-09-16 ENCOUNTER — Ambulatory Visit (INDEPENDENT_AMBULATORY_CARE_PROVIDER_SITE_OTHER): Payer: Medicare Other

## 2017-09-16 VITALS — BP 140/68 | HR 67 | Temp 97.8°F | Resp 12 | Ht 62.0 in | Wt 176.2 lb

## 2017-09-16 DIAGNOSIS — Z0000 Encounter for general adult medical examination without abnormal findings: Secondary | ICD-10-CM

## 2017-09-16 NOTE — Patient Instructions (Signed)
Barbara West , Thank you for taking time to come for your Medicare Wellness Visit. I appreciate your ongoing commitment to your health goals. Please review the following plan we discussed and let me know if I can assist you in the future.   Screening recommendations/referrals: Colorectal Screening: Completed 05/06/08. Repeat every 10 years Mammogram: Completed 02/26/17. Repeat every year   Bone Density: Completed 05/06/10. Osteoporotic screenings no longer required  Vision and Dental Exams: Recommended annual ophthalmology exams for early detection of glaucoma and other disorders of the eye Recommended annual dental exams for proper oral hygiene  Vaccinations: Influenza vaccine: Up to date Pneumococcal vaccine: Completed series Tdap vaccine: Declined. Please call your insurance company to determine your out of pocket expense. You may also receive this vaccine at your local pharmacy or Health Dept. Shingles vaccine: Please call your insurance company to determine your out of pocket expense for the Shingrix vaccine. You may also receive this vaccine at your local pharmacy or Health Dept.   Advanced directives: Please bring a copy of your POA (Power of Attorney) and/or Living Will to your next appointment.  Conditions/risks identified: Recommend to drink at least 6-8 8oz glasses of water per day.  Next appointment: Please schedule your Annual Wellness Visit with your Nurse Health Advisor in one year.  Preventive Care 45 Years and Older, Female Preventive care refers to lifestyle choices and visits with your health care provider that can promote health and wellness. What does preventive care include?  A yearly physical exam. This is also called an annual well check.  Dental exams once or twice a year.  Routine eye exams. Ask your health care provider how often you should have your eyes checked.  Personal lifestyle choices, including:  Daily care of your teeth and gums.  Regular physical  activity.  Eating a healthy diet.  Avoiding tobacco and drug use.  Limiting alcohol use.  Practicing safe sex.  Taking low-dose aspirin every day.  Taking vitamin and mineral supplements as recommended by your health care provider. What happens during an annual well check? The services and screenings done by your health care provider during your annual well check will depend on your age, overall health, lifestyle risk factors, and family history of disease. Counseling  Your health care provider may ask you questions about your:  Alcohol use.  Tobacco use.  Drug use.  Emotional well-being.  Home and relationship well-being.  Sexual activity.  Eating habits.  History of falls.  Memory and ability to understand (cognition).  Work and work Statistician.  Reproductive health. Screening  You may have the following tests or measurements:  Height, weight, and BMI.  Blood pressure.  Lipid and cholesterol levels. These may be checked every 5 years, or more frequently if you are over 21 years old.  Skin check.  Lung cancer screening. You may have this screening every year starting at age 52 if you have a 30-pack-year history of smoking and currently smoke or have quit within the past 15 years.  Fecal occult blood test (FOBT) of the stool. You may have this test every year starting at age 32.  Flexible sigmoidoscopy or colonoscopy. You may have a sigmoidoscopy every 5 years or a colonoscopy every 10 years starting at age 90.  Hepatitis C blood test.  Hepatitis B blood test.  Sexually transmitted disease (STD) testing.  Diabetes screening. This is done by checking your blood sugar (glucose) after you have not eaten for a while (fasting). You may have  this done every 1-3 years.  Bone density scan. This is done to screen for osteoporosis. You may have this done starting at age 51.  Mammogram. This may be done every 1-2 years. Talk to your health care provider about  how often you should have regular mammograms. Talk with your health care provider about your test results, treatment options, and if necessary, the need for more tests. Vaccines  Your health care provider may recommend certain vaccines, such as:  Influenza vaccine. This is recommended every year.  Tetanus, diphtheria, and acellular pertussis (Tdap, Td) vaccine. You may need a Td booster every 10 years.  Zoster vaccine. You may need this after age 19.  Pneumococcal 13-valent conjugate (PCV13) vaccine. One dose is recommended after age 64.  Pneumococcal polysaccharide (PPSV23) vaccine. One dose is recommended after age 11. Talk to your health care provider about which screenings and vaccines you need and how often you need them. This information is not intended to replace advice given to you by your health care provider. Make sure you discuss any questions you have with your health care provider. Document Released: 05/18/2015 Document Revised: 01/09/2016 Document Reviewed: 02/20/2015 Elsevier Interactive Patient Education  2017 [employer] Prevention in the Home Falls can cause injuries. They can happen to people of all ages. There are many things you can do to make your home safe and to help prevent falls. What can I do on the outside of my home?  Regularly fix the edges of walkways and driveways and fix any cracks.  Remove anything that might make you trip as you walk through a door, such as a raised step or threshold.  Trim any bushes or trees on the path to your home.  Use bright outdoor lighting.  Clear any walking paths of anything that might make someone trip, such as rocks or tools.  Regularly check to see if handrails are loose or broken. Make sure that both sides of any steps have handrails.  Any raised decks and porches should have guardrails on the edges.  Have any leaves, snow, or ice cleared regularly.  Use sand or salt on walking paths during  winter.  Clean up any spills in your garage right away. This includes oil or grease spills. What can I do in the bathroom?  Use night lights.  Install grab bars by the toilet and in the tub and shower. Do not use towel bars as grab bars.  Use non-skid mats or decals in the tub or shower.  If you need to sit down in the shower, use a plastic, non-slip stool.  Keep the floor dry. Clean up any water that spills on the floor as soon as it happens.  Remove soap buildup in the tub or shower regularly.  Attach bath mats securely with double-sided non-slip rug tape.  Do not have throw rugs and other things on the floor that can make you trip. What can I do in the bedroom?  Use night lights.  Make sure that you have a light by your bed that is easy to reach.  Do not use any sheets or blankets that are too big for your bed. They should not hang down onto the floor.  Have a firm chair that has side arms. You can use this for support while you get dressed.  Do not have throw rugs and other things on the floor that can make you trip. What can I do in the kitchen?  Clean up any  spills right away.  Avoid walking on wet floors.  Keep items that you use a lot in easy-to-reach places.  If you need to reach something above you, use a strong step stool that has a grab bar.  Keep electrical cords out of the way.  Do not use floor polish or wax that makes floors slippery. If you must use wax, use non-skid floor wax.  Do not have throw rugs and other things on the floor that can make you trip. What can I do with my stairs?  Do not leave any items on the stairs.  Make sure that there are handrails on both sides of the stairs and use them. Fix handrails that are broken or loose. Make sure that handrails are as long as the stairways.  Check any carpeting to make sure that it is firmly attached to the stairs. Fix any carpet that is loose or worn.  Avoid having throw rugs at the top or  bottom of the stairs. If you do have throw rugs, attach them to the floor with carpet tape.  Make sure that you have a light switch at the top of the stairs and the bottom of the stairs. If you do not have them, ask someone to add them for you. What else can I do to help prevent falls?  Wear shoes that:  Do not have high heels.  Have rubber bottoms.  Are comfortable and fit you well.  Are closed at the toe. Do not wear sandals.  If you use a stepladder:  Make sure that it is fully opened. Do not climb a closed stepladder.  Make sure that both sides of the stepladder are locked into place.  Ask someone to hold it for you, if possible.  Clearly mark and make sure that you can see:  Any grab bars or handrails.  First and last steps.  Where the edge of each step is.  Use tools that help you move around (mobility aids) if they are needed. These include:  Canes.  Walkers.  Scooters.  Crutches.  Turn on the lights when you go into a dark area. Replace any light bulbs as soon as they burn out.  Set up your furniture so you have a clear path. Avoid moving your furniture around.  If any of your floors are uneven, fix them.  If there are any pets around you, be aware of where they are.  Review your medicines with your doctor. Some medicines can make you feel dizzy. This can increase your chance of falling. Ask your doctor what other things that you can do to help prevent falls. This information is not intended to replace advice given to you by your health care provider. Make sure you discuss any questions you have with your health care provider. Document Released: 02/15/2009 Document Revised: 09/27/2015 Document Reviewed: 05/26/2014 Elsevier Interactive Patient Education  2017 [employer].

## 2017-09-16 NOTE — Progress Notes (Signed)
Subjective:   Barbara West is a 72 y.o. female who presents for Medicare Annual (Subsequent) preventive examination.  Review of Systems:  N/A Cardiac Risk Factors include: advanced age (>70mn, >>9women);dyslipidemia;hypertension;obesity (BMI >30kg/m2);sedentary lifestyle     Objective:     Vitals: BP 140/68 (BP Location: Right Arm, Patient Position: Sitting, Cuff Size: Large)   Pulse 67   Temp 97.8 F (36.6 C) (Oral)   Resp 12   Ht 5' 2"  (1.575 m)   Wt 176 lb 3.2 oz (79.9 kg)   SpO2 96%   BMI 32.23 kg/m   Body mass index is 32.23 kg/m.  Advanced Directives 09/16/2017 09/05/2016 07/11/2015  Does Patient Have a Medical Advance Directive? Yes Yes Yes  Type of AParamedicof AGruverLiving will HSan AnselmoLiving will [place]ellows FallsLiving will  Copy of [place] Chart? No - copy requested No - copy requested -    Tobacco Social History   Tobacco Use  Smoking Status Never Smoker  Smokeless Tobacco Never Used  Tobacco Comment   smoking cessation materials not required     Counseling given: No Comment: smoking cessation materials not required  Clinical Intake:  Pre-visit preparation completed: Yes  Pain : No/denies pain   BMI - recorded: 32.23 Nutritional Status: BMI > 30  Obese Nutritional Risks: None Diabetes: No  How often do you need to have someone help you when you read instructions, pamphlets, or other written materials from your doctor or pharmacy?: 1 - Never  Interpreter Needed?: No  Information entered by :: AEversole, LPN  Past Medical History:  Diagnosis Date  . GERD (gastroesophageal reflux disease)   . Hyperlipidemia   . Hypertension   . Seasonal allergies   . Trochanteric bursitis   . Vertigo   . Vitamin D deficiency    Past Surgical History:  Procedure Laterality Date  . CATARACT EXTRACTION Bilateral   . ESOPHAGOGASTRODUODENOSCOPY  2010   [place]  .  REPLACEMENT TOTAL KNEE BILATERAL Bilateral    Family History  Problem Relation Age of Onset  . Alzheimer's disease Mother   . Heart disease Brother    Social History   Socioeconomic History  . Marital status: Married    Spouse name: Not on file  . Number of children: 0  . Years of education: Not on file  . Highest education level: Associate degree: occupational, tHotel manager or vocational program  Occupational History  . Occupation: Retired  SScientific laboratory technician . Financial resource strain: Not hard at all  . Food insecurity:    Worry: Never true    Inability: Never true  . Transportation needs:    Medical: No    Non-medical: No  Tobacco Use  . Smoking status: Never Smoker  . Smokeless tobacco: Never Used  . Tobacco comment: smoking cessation materials not required  Substance and Sexual Activity  . Alcohol use: Yes    Alcohol/week: 1.2 oz    Types: 2 Standard drinks or equivalent per week    Comment: one glass of wine per month  . Drug use: No  . Sexual activity: Not Currently  Lifestyle  . Physical activity:    Days per week: 0 days    Minutes per session: 0 min  . Stress: Not at all  Relationships  . Social connections:    Talks on phone: Patient refused    Gets together: Patient refused    Attends religious service: Patient refused  Active member of club or organization: Patient refused    Attends meetings of clubs or organizations: Patient refused    Relationship status: Married  Other Topics Concern  . Not on file  Social History Narrative  . Not on file    Outpatient Encounter Medications as of 09/16/2017  Medication Sig  . albuterol (PROVENTIL HFA;VENTOLIN HFA) 108 (90 Base) MCG/ACT inhaler Inhale 2 puffs into the lungs every 6 (six) hours as needed for wheezing or shortness of breath.  Marland Kitchen aspirin EC 81 MG tablet Take 81 mg by mouth daily.  . Azelastine-Fluticasone (DYMISTA) 137-50 MCG/ACT SUSP Place into the nose.  . cholecalciferol (VITAMIN D) 400 units  TABS tablet Take 400 Units by mouth.  . conjugated estrogens (PREMARIN) vaginal cream Place 1 Applicatorful vaginally daily.  . fexofenadine (ALLEGRA) 180 MG tablet Take by mouth.  . gabapentin (NEURONTIN) 100 MG capsule Take by mouth.  . hydrochlorothiazide (HYDRODIURIL) 12.5 MG tablet TAKE 1 TABLET BY MOUTH EVERY DAY  . Ibuprofen 200 MG CAPS Take 1 capsule by mouth as needed.   . lansoprazole (PREVACID) 30 MG capsule Take 1 capsule (30 mg total) by mouth 2 (two) times daily before a meal.  . lidocaine (LIDODERM) 5 % Place 1 patch onto the skin daily.   Marland Kitchen olopatadine (PATANOL) 0.1 % ophthalmic solution Apply to eye.  . ranitidine (ZANTAC) 150 MG tablet TAKE 1 TABLET(150 MG) BY MOUTH TWICE DAILY  . promethazine (PHENERGAN) 12.5 MG tablet Take 1 tablet (12.5 mg total) by mouth every 8 (eight) hours as needed for nausea or vomiting. (Patient not taking: Reported on 09/16/2017)  . [DISCONTINUED] fluconazole (DIFLUCAN) 100 MG tablet Take 1 tablet (100 mg total) by mouth daily.  . [DISCONTINUED] ondansetron (ZOFRAN) 4 MG tablet Take 1 tablet (4 mg total) by mouth every 8 (eight) hours as needed for nausea or vomiting.  . [DISCONTINUED] predniSONE (DELTASONE) 5 MG tablet Take 0.5 mg by mouth daily with breakfast.    No facility-administered encounter medications on file as of 09/16/2017.     Activities of Daily Living In your present state of health, do you have any difficulty performing the following activities: 09/16/2017  Hearing? N  Comment denies hearing aids; vertigo  Vision? N  Comment wears reading glasses  Difficulty concentrating or making decisions? N  Walking or climbing stairs? Y  Comment back pain  Dressing or bathing? N  Doing errands, shopping? N  Preparing Food and eating ? N  Comment denies dentures  Using the Toilet? N  In the past six months, have you accidently leaked urine? N  Do you have problems with loss of bowel control? N  Managing your Medications? N  Managing  your Finances? N  Housekeeping or managing your Housekeeping? N  Some recent data might be hidden    Patient Care Team: Glean Hess, MD as PCP - General (Internal Medicine) Nadene Rubins, DO as Consulting Physician (Physical Medicine and Rehabilitation) Dia Sitter, MD as Consulting Physician (Otolaryngology) San Jetty, MD as Consulting Physician (Gastroenterology)    Assessment:   This is a routine wellness examination for Barbara West.  Exercise Activities and Dietary recommendations Current Exercise Habits: The patient does not participate in regular exercise at present, Exercise limited by: None identified  Goals    . Decrease the likelihood of falling     Plans to go to [facility] physical therapy/exercise programs to help with balance and endurance   Goal Met and she is in medical wellness program.    .  DIET - INCREASE WATER INTAKE     Recommend to drink at least 6-8 8oz glasses of water per day.       Fall Risk Fall Risk  09/16/2017 09/05/2016 10/22/2015 01/22/2015  Falls in the past year? No No No No  Risk for fall due to : Impaired vision;Impaired balance/gait - - -  Risk for fall due to: Comment walks with cane, wears eyeglasses, vertigo - - -   FALL RISK PREVENTION PERTAINING TO HOME: Is your home free of loose throw rugs in walkways, pet beds, electrical cords, etc? Yes Is there adequate lighting in your home to reduce risk of falls?  Yes Are there stairs in or around your home WITH handrails? Yes  ASSISTIVE DEVICES UTILIZED TO PREVENT FALLS: Use of a cane, walker or w/c? Yes, use of cane for ambulation Grab bars in the bathroom? No  Shower chair or a place to sit while bathing? Yes An elevated toilet seat or a handicapped toilet? No  Timed Get Up and Go Performed: Yes. Pt ambulated 10 feet within 22 sec. Gait slow, steady and with the use of an assistive device. No intervention required at this time. Fall risk prevention has been discussed.  Community  Resource Referral:  Pt declined my offer to send [employer] Referral to Care Guide for installation of grab bars in the shower or an elevated toilet seat.  Depression Screen PHQ 2/9 Scores 09/16/2017 09/16/2017 09/05/2016 10/22/2015  PHQ - 2 Score 0 0 0 0  PHQ- 9 Score 0 - - -     Cognitive Function     6CIT Screen 09/16/2017 09/05/2016  What Year? 0 points 0 points  What month? 0 points 0 points  What time? 0 points 0 points  Count back from 20 0 points 0 points  Months in reverse 0 points 0 points  Repeat phrase 0 points 0 points  Total Score 0 0    Immunization History  Administered Date(s) Administered  . Influenza, High Dose Seasonal PF 02/13/2017  . Influenza,inj,quad, With Preservative 02/13/2017  . Influenza-Unspecified 03/06/2015, 02/06/2016, 02/20/2017  . Pneumococcal Conjugate-13 02/20/2014  . Pneumococcal Polysaccharide-23 04/23/2015    Qualifies for Shingles Vaccine? Yes. Due for Shingrix. Education has been provided regarding the importance of this vaccine. Pt has been advised to call her insurance company to determine her out of pocket expense. Advised she may also receive this vaccine at her local pharmacy or Health Dept. Verbalized acceptance and understanding.  Due for Tdap vaccine. Education has been provided regarding the importance of this vaccine. Pt has been advised she may receive this vaccine at her local pharmacy or Health Dept. Also advised to provide a copy of her vaccination record if she chooses to receive this vaccine at her local pharmacy. Verbalized acceptance and understanding.  Screening Tests Health Maintenance  Topic Date Due  . TETANUS/TDAP  09/17/2018 (Originally 08/14/1964)  . INFLUENZA VACCINE  12/03/2017  . MAMMOGRAM  02/26/2018  . COLONOSCOPY  05/06/2018  . DEXA SCAN  Completed  . Hepatitis C Screening  Completed  . PNA vac Low Risk Adult  Completed    Cancer Screenings: Lung: Low Dose CT Chest recommended if Age 52-80 years,  30 pack-year currently smoking OR have quit w/in 15years. Patient does not qualify. Breast:  Up to date on Mammogram? Yes. Completed 02/26/17. Repeat every year   Up to date of Bone Density/Dexa? Yes. Completed 05/06/10. Osteoporotic screenings no longer required Colorectal: Completed 05/06/08. Repeat every 10 years  Additional Screenings: Hepatitis C Screening: Completed 04/23/15    Plan:  I have personally reviewed and addressed the Medicare Annual Wellness questionnaire and have noted the following in the patient's chart:  A. Medical and social history B. Use of alcohol, tobacco or illicit drugs  C. Current medications and supplements D. Functional ability and status E.  Nutritional status F.  Physical activity G. Advance directives H. List of other physicians I.  Hospitalizations, surgeries, and ER visits in previous 12 months J.  [place] such as hearing and vision if needed, cognitive and depression L. Referrals and appointments  In addition, I have reviewed and discussed with patient certain preventive protocols, quality metrics, and best practice recommendations. A written personalized care plan for preventive services as well as general preventive health recommendations were provided to patient.  Signed,  Aleatha Borer, LPN Nurse Health Advisor  MD Recommendations: Due for Shingrix. Education has been provided regarding the importance of this vaccine. Pt has been advised to call her insurance company to determine her out of pocket expense. Advised she may also receive this vaccine at her local pharmacy or Health Dept. Verbalized acceptance and understanding.  Due for Tdap vaccine. Education has been provided regarding the importance of this vaccine. Pt has been advised she may receive this vaccine at her local pharmacy or Health Dept. Also advised to provide a copy of her vaccination record if she chooses to receive this vaccine at her local pharmacy. Verbalized  acceptance and understanding.

## 2017-09-19 ENCOUNTER — Other Ambulatory Visit: Payer: Self-pay | Admitting: Internal Medicine

## 2017-09-23 ENCOUNTER — Ambulatory Visit: Payer: Medicare Other | Admitting: Internal Medicine

## 2017-09-23 ENCOUNTER — Encounter: Payer: Self-pay | Admitting: Internal Medicine

## 2017-09-23 VITALS — BP 121/81 | HR 69 | Resp 16 | Ht 62.0 in | Wt 176.0 lb

## 2017-09-23 DIAGNOSIS — K2100 Gastro-esophageal reflux disease with esophagitis, without bleeding: Secondary | ICD-10-CM

## 2017-09-23 DIAGNOSIS — R2689 Other abnormalities of gait and mobility: Secondary | ICD-10-CM | POA: Diagnosis not present

## 2017-09-23 DIAGNOSIS — E782 Mixed hyperlipidemia: Secondary | ICD-10-CM | POA: Diagnosis not present

## 2017-09-23 DIAGNOSIS — I10 Essential (primary) hypertension: Secondary | ICD-10-CM | POA: Diagnosis not present

## 2017-09-23 DIAGNOSIS — Z0000 Encounter for general adult medical examination without abnormal findings: Secondary | ICD-10-CM | POA: Diagnosis not present

## 2017-09-23 DIAGNOSIS — Z1231 Encounter for screening mammogram for malignant neoplasm of breast: Secondary | ICD-10-CM

## 2017-09-23 LAB — POCT URINALYSIS DIPSTICK
Bilirubin, UA: NEGATIVE
Blood, UA: NEGATIVE
Glucose, UA: NEGATIVE
Ketones, UA: NEGATIVE
LEUKOCYTES UA: NEGATIVE
Nitrite, UA: NEGATIVE
PH UA: 7.5 (ref 5.0–8.0)
PROTEIN UA: NEGATIVE
Spec Grav, UA: 1.01 (ref 1.010–1.025)
Urobilinogen, UA: 0.2 E.U./dL

## 2017-09-23 MED ORDER — LANSOPRAZOLE 30 MG PO CPDR
30.0000 mg | DELAYED_RELEASE_CAPSULE | Freq: Two times a day (BID) | ORAL | 3 refills | Status: DC
Start: 2017-09-23 — End: 2019-05-11

## 2017-09-23 MED ORDER — RANITIDINE HCL 150 MG PO TABS: 150.0000 mg | ORAL_TABLET | Freq: Two times a day (BID) | ORAL | 3 refills | Status: DC

## 2017-09-23 NOTE — Patient Instructions (Signed)

## 2017-09-23 NOTE — Progress Notes (Signed)
Date:  09/23/2017   Name:  LUCYNDA ROSANO   DOB:  [birth year]   MRN:  [id number]   Chief Complaint: Annual Exam Barbara West is a 72 y.o. female who presents today for her Complete Annual Exam. She feels fairly well. She reports exercising walking some with her rollator. She reports she is sleeping fairly well.  She denies breast issues.  She is due for mammogram in October.  All immunizations are up to date.  She is waiting to get Shingrix.  Hypertension  This is a chronic problem. The problem is controlled. Pertinent negatives include no chest pain, headaches, palpitations or shortness of breath. Past treatments include diuretics. The current treatment provides significant improvement.  Gastroesophageal Reflux  She complains of heartburn. She reports no abdominal pain, no chest pain, no coughing or no wheezing. This is a recurrent problem. Pertinent negatives include no fatigue. She has tried a PPI and a histamine-2 antagonist for the symptoms. The treatment provided significant relief.  Hyperlipidemia  This is a chronic problem. Condition status: high HDL and LDL. There are no known factors aggravating her hyperlipidemia. Pertinent negatives include no chest pain or shortness of breath. Current antihyperlipidemic treatment includes diet change. There are no compliance problems.   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  Just started Sinemet to see if there is any benefit.  She is using a rolling walker "Drive".   PMR - resolved s/p long course of prednisone.  No longer seeing Rheumatology.  Lab Results  Component Value Date   CREATININE 0.90 12/15/2016   BUN 8 12/15/2016   NA 138 12/15/2016   K 4.1 12/15/2016   CL 96 12/15/2016   CO2 25 12/15/2016   Lab Results  Component Value Date   CHOL 318 (H) 09/05/2016   HDL 81 09/05/2016   LDLCALC 206 (H) 09/05/2016   TRIG 155 (H) 09/05/2016   CHOLHDL 3.9 09/05/2016      Review of Systems    Constitutional: Negative for chills, fatigue and fever.  HENT: Negative for congestion, hearing loss, tinnitus, trouble swallowing and voice change.   Eyes: Negative for visual disturbance.  Respiratory: Negative for cough, chest tightness, shortness of breath and wheezing.   Cardiovascular: Negative for chest pain, palpitations and leg swelling.  Gastrointestinal: Positive for heartburn. Negative for abdominal pain, constipation, diarrhea and vomiting.  Endocrine: Negative for polydipsia and polyuria.  Genitourinary: Negative for dysuria, frequency, genital sores, vaginal bleeding and vaginal discharge.  Musculoskeletal: Positive for arthralgias and gait problem. Negative for joint swelling.  Skin: Negative for color change and rash.  Neurological: Negative for dizziness, tremors, weakness, light-headedness and headaches.  Hematological: Negative for adenopathy. Does not bruise/bleed easily.  Psychiatric/Behavioral: Negative for dysphoric mood and sleep disturbance. The patient is not nervous/anxious.     Patient Active Problem List   Diagnosis Date Noted  . 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 Disability of walking 12/15/2016  . Trochanteric bursitis of both hips 09/05/2016  . 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 Avitaminosis D 01/22/2015  . Allergic rhinitis, seasonal 01/22/2015  . Hyperlipidemia 01/22/2015  . 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 Essential (primary) hypertension 01/22/2015  . Gastroesophageal reflux disease with esophagitis 01/22/2015    Prior to Admission medications   Medication Sig Start Date End Date Taking? Authorizing Provider       Glean Hess, MD       [provider]  [provider]       [provider]       Glean Hess, MD       [provider]       [provider]       Glean Hess, MD       [provider]        Glean Hess, MD       [provider]       [provider]       Glean Hess, MD       Glean Hess, MD   Current Meds  Medication Sig  . aspirin EC 81 MG tablet Take 81 mg by mouth daily.  . Azelastine-Fluticasone (DYMISTA) 137-50 MCG/ACT SUSP Place into the nose daily.   . Carbidopa-Levodopa ER (SINEMET CR) 25-100 MG tablet controlled release Take 1 tablet by mouth at bedtime.  . cholecalciferol (VITAMIN D) 400 units TABS tablet Take 400 Units by mouth.  . conjugated estrogens (PREMARIN) vaginal cream Place 1 Applicatorful vaginally daily.  . fexofenadine (ALLEGRA) 180 MG tablet Take 180 mg by mouth daily.   Marland Kitchen gabapentin (NEURONTIN) 100 MG capsule Take 100 mg by mouth at bedtime.   . hydrochlorothiazide (HYDRODIURIL) 12.5 MG tablet TAKE 1 TABLET BY MOUTH EVERY DAY  . Ibuprofen 200 MG CAPS Take 1 capsule by mouth as needed.   . lansoprazole (PREVACID) 30 MG capsule Take 1 capsule (30 mg total) by mouth 2 (two) times daily before a meal.  . lidocaine (LIDODERM) 5 % Place 1 patch onto the skin daily.   . meclizine (ANTIVERT) 25 MG tablet Take 25 mg by mouth 3 (three) times daily as needed for dizziness.  . multivitamin-lutein (OCUVITE-LUTEIN) CAPS capsule Take 1 capsule by mouth daily.  Marland Kitchen olopatadine (PATANOL) 0.1 % ophthalmic solution Apply to eye.  . promethazine (PHENERGAN) 12.5 MG tablet Take 1 tablet (12.5 mg total) by mouth every 8 (eight) hours as needed for nausea or vomiting.  . ranitidine (ZANTAC) 150 MG tablet Take 1 tablet (150 mg total) by mouth 2 (two) times daily.  . [DISCONTINUED] albuterol (PROVENTIL HFA;VENTOLIN HFA) 108 (90 Base) MCG/ACT inhaler Inhale 2 puffs into the lungs every 6 (six) hours as needed for wheezing or shortness of breath.  . [DISCONTINUED] lansoprazole (PREVACID) 30 MG capsule Take 1 capsule (30 mg total) by mouth 2 (two) times daily before a meal.  . [DISCONTINUED] ranitidine (ZANTAC) 150 MG tablet TAKE 1  TABLET(150 MG) BY MOUTH TWICE DAILY     Allergies  Allergen Reactions  . Celecoxib Anaphylaxis  . Atorvastatin   . Clarithromycin Other (See Comments)  . Codeine Other (See Comments), Nausea Only and Nausea And Vomiting  . Losartan Potassium Other (See Comments)  . Morphine And Related Other (See Comments), Nausea Only and Nausea And Vomiting  . Shellfish Allergy Nausea Only  . Sulfa Antibiotics Nausea And Vomiting  . Mupirocin Rash    Cream/ointment.  . Penicillins Rash    Past Surgical History:  Procedure Laterality Date  . CATARACT EXTRACTION Bilateral   . ESOPHAGOGASTRODUODENOSCOPY  2010   [facility]  . REPLACEMENT TOTAL KNEE BILATERAL Bilateral     Social History   Tobacco Use  . Smoking status: Never Smoker  . Smokeless tobacco: Never Used  . Tobacco comment: smoking cessation materials not required  Substance Use Topics  . Alcohol use: Yes    Alcohol/week: 1.2 oz    Types: 2 Standard drinks or  equivalent per week    Comment: one glass of wine per month  . Drug use: No     Medication list has been reviewed and updated.  PHQ 2/9 Scores 09/23/2017 09/16/2017 09/16/2017 09/05/2016  PHQ - 2 Score 0 0 0 0  PHQ- 9 Score 0 0 - -    Physical Exam  Constitutional: She is oriented to person, place, and time. She appears well-developed and well-nourished. No distress.  HENT:  Head: Normocephalic and atraumatic.  Right Ear: Tympanic membrane and ear canal normal.  Left Ear: Tympanic membrane and ear canal normal.  Nose: Right sinus exhibits no maxillary sinus tenderness. Left sinus exhibits no maxillary sinus tenderness.  Mouth/Throat: Uvula is midline and oropharynx is clear and moist.  Eyes: Conjunctivae and EOM are normal. Right eye exhibits no discharge. Left eye exhibits no discharge. No scleral icterus.  Neck: Normal range of motion. Carotid bruit is not present. No erythema present. No thyromegaly present.  Cardiovascular: Normal rate, regular rhythm, normal  heart sounds and normal pulses.  Pulmonary/Chest: Effort normal. No respiratory distress. She has no wheezes. Right breast exhibits no mass, no nipple discharge, no skin change and no tenderness. Left breast exhibits no mass, no nipple discharge, no skin change and no tenderness.  Abdominal: Soft. Bowel sounds are normal. There is no hepatosplenomegaly. There is no tenderness. There is no CVA tenderness.  Musculoskeletal: Normal range of motion.  Moderate gait instability - using a rolling walker  Lymphadenopathy:    She has no cervical adenopathy.    She has no axillary adenopathy.  Neurological: She is alert and oriented to person, place, and time. She has normal reflexes. No cranial nerve deficit or sensory deficit.  Skin: Skin is warm, dry and intact. No rash noted.  Psychiatric: She has a normal mood and affect. Her speech is normal and behavior is normal. Thought content normal.  Nursing note and vitals reviewed.   BP 121/81   Pulse 69   Resp 16   Ht 5\' 2"  (1.575 m)   Wt 176 lb (79.8 kg)   SpO2 98%   BMI 32.19 kg/m   Assessment and Plan: 1. Annual physical exam Has done well with weight loss Exercise as able MAW done recently - POCT urinalysis dipstick  2. Essential (primary) hypertension controlled - CBC with Differential/Platelet - Comprehensive metabolic panel - TSH  3. Gastroesophageal reflux disease with esophagitis Improved with high dose PPI and H2 blocker - lansoprazole (PREVACID) 30 MG capsule; Take 1 capsule (30 mg total) by mouth 2 (two) times daily before a meal.  Dispense: 180 capsule; Refill: 3 - ranitidine (ZANTAC) 150 MG tablet; Take 1 tablet (150 mg total) by mouth 2 (two) times daily.  Dispense: 180 tablet; Refill: 3  4. Mixed hyperlipidemia Will advise if medication is needed; continue low carb low fat diet - Lipid panel  5. Primary progressive freezing gait Continue walker and Sinemet Follow up with Neurology  6. Encounter for screening  mammogram for breast cancer - [facility] DIGITAL SCREENING BILATERAL   Meds ordered this encounter  Medications  . lansoprazole (PREVACID) 30 MG capsule    Sig: Take 1 capsule (30 mg total) by mouth 2 (two) times daily before a meal.    Dispense:  180 capsule    Refill:  3  . ranitidine (ZANTAC) 150 MG tablet    Sig: Take 1 tablet (150 mg total) by mouth 2 (two) times daily.    Dispense:  180 tablet  Refill:  3    Partially dictated using Editor, commissioning. Any errors are unintentional.  Halina Maidens, MD [facility]  09/23/2017

## 2017-09-24 LAB — COMPREHENSIVE METABOLIC PANEL
ALBUMIN: 4.1 g/dL (ref 3.5–4.8)
ALT: 10 IU/L (ref 0–32)
AST: 11 IU/L (ref 0–40)
Albumin/Globulin Ratio: 1.5 (ref 1.2–2.2)
Alkaline Phosphatase: 113 IU/L (ref 39–117)
BILIRUBIN TOTAL: 0.3 mg/dL (ref 0.0–1.2)
BUN / CREAT RATIO: 15 (ref 12–28)
BUN: 13 mg/dL (ref 8–27)
CHLORIDE: 103 mmol/L (ref 96–106)
CO2: 21 mmol/L (ref 20–29)
Calcium: 9.8 mg/dL (ref 8.7–10.3)
Creatinine, Ser: 0.86 mg/dL (ref 0.57–1.00)
GFR calc Af Amer: 78 mL/min/{1.73_m2} (ref >59)
GFR calc non Af Amer: 68 mL/min/{1.73_m2} (ref >59)
GLOBULIN, TOTAL: 2.7 g/dL (ref 1.5–4.5)
Glucose: 94 mg/dL (ref 65–99)
POTASSIUM: 4.2 mmol/L (ref 3.5–5.2)
SODIUM: 142 mmol/L (ref 134–144)
Total Protein: 6.8 g/dL (ref 6.0–8.5)

## 2017-09-24 LAB — CBC WITH DIFFERENTIAL/PLATELET
BASOS ABS: 0 10*3/uL (ref 0.0–0.2)
Basos: 0 %
EOS (ABSOLUTE): 0.1 10*3/uL (ref 0.0–0.4)
EOS: 1 %
HEMATOCRIT: 43.6 % (ref 34.0–46.6)
HEMOGLOBIN: 14 g/dL (ref 11.1–15.9)
Immature Grans (Abs): 0 10*3/uL (ref 0.0–0.1)
Immature Granulocytes: 0 %
LYMPHS ABS: 3.3 10*3/uL — AB (ref 0.7–3.1)
Lymphs: 28 %
MCH: 29.5 pg (ref 26.6–33.0)
MCHC: 32.1 g/dL (ref 31.5–35.7)
MCV: 92 fL (ref 79–97)
MONOCYTES: 11 %
MONOS ABS: 1.4 10*3/uL — AB (ref 0.1–0.9)
NEUTROS ABS: 7.1 10*3/uL — AB (ref 1.4–7.0)
Neutrophils: 60 %
Platelets: 385 10*3/uL (ref 150–450)
RBC: 4.75 x10E6/uL (ref 3.77–5.28)
RDW: 14.2 % (ref 12.3–15.4)
WBC: 12 10*3/uL — ABNORMAL HIGH (ref 3.4–10.8)

## 2017-09-24 LAB — LIPID PANEL
CHOLESTEROL TOTAL: 345 mg/dL — AB (ref 100–199)
Chol/HDL Ratio: 4.3 ratio (ref 0.0–4.4)
HDL: 80 mg/dL (ref >39)
LDL Calculated: 234 mg/dL — ABNORMAL HIGH (ref 0–99)
Triglycerides: 153 mg/dL — ABNORMAL HIGH (ref 0–149)
VLDL Cholesterol Cal: 31 mg/dL (ref 5–40)

## 2017-09-24 LAB — TSH: TSH: 2.8 u[IU]/mL (ref 0.450–4.500)

## 2017-10-07 DIAGNOSIS — D1801 Hemangioma of skin and subcutaneous tissue: Secondary | ICD-10-CM | POA: Diagnosis not present

## 2017-10-07 DIAGNOSIS — L570 Actinic keratosis: Secondary | ICD-10-CM | POA: Diagnosis not present

## 2017-10-07 DIAGNOSIS — L820 Inflamed seborrheic keratosis: Secondary | ICD-10-CM | POA: Diagnosis not present

## 2017-10-07 DIAGNOSIS — Z85828 Personal history of other malignant neoplasm of skin: Secondary | ICD-10-CM | POA: Diagnosis not present

## 2017-10-07 DIAGNOSIS — L821 Other seborrheic keratosis: Secondary | ICD-10-CM | POA: Diagnosis not present

## 2018-01-12 ENCOUNTER — Other Ambulatory Visit: Payer: Self-pay | Admitting: Internal Medicine

## 2018-01-12 DIAGNOSIS — Z23 Encounter for immunization: Secondary | ICD-10-CM

## 2018-01-12 MED ORDER — ZOSTER VAC RECOMB ADJUVANTED 50 MCG/0.5ML IM SUSR: 0.5000 mL | Freq: Once | INTRAMUSCULAR | 1 refills | Status: AC

## 2018-02-04 ENCOUNTER — Encounter: Payer: Self-pay | Admitting: Internal Medicine

## 2018-09-10 ENCOUNTER — Other Ambulatory Visit: Payer: Self-pay | Admitting: Internal Medicine

## 2018-10-06 ENCOUNTER — Ambulatory Visit (INDEPENDENT_AMBULATORY_CARE_PROVIDER_SITE_OTHER): Payer: Medicare Other

## 2018-10-06 VITALS — BP 121/76 | HR 77 | Ht 62.0 in | Wt 169.0 lb

## 2018-10-06 DIAGNOSIS — Z0000 Encounter for general adult medical examination without abnormal findings: Secondary | ICD-10-CM | POA: Diagnosis not present

## 2018-10-06 DIAGNOSIS — Z1211 Encounter for screening for malignant neoplasm of colon: Secondary | ICD-10-CM

## 2018-10-06 NOTE — Progress Notes (Signed)
Subjective:   Barbara West is a 73 y.o. female who presents for Medicare Annual (Subsequent) preventive examination.   Virtual Visit via Telephone Note  I connected with LAMONT GLASSCOCK on 10/06/18 at  1:20 PM EDT by telephone and verified that I am speaking with the correct person using two identifiers.  Medicare Annual Wellness visit completed telephonically due to Covid-19 pandemic.   Location: Patient: home Provider: office   I discussed the limitations, risks, security and privacy concerns of performing an evaluation and management service by telephone and the availability of in person appointments. The patient expressed understanding and agreed to proceed.  Some vital signs may be absent or patient reported.    Clemetine Marker, LPN   Review of Systems:   Cardiac Risk Factors include: advanced age (>63mn, >>47women);hypertension;dyslipidemia;obesity (BMI >30kg/m2)     Objective:     Vitals: BP 121/76   Pulse 77   Ht 5' 2"  (1.575 m)   Wt 169 lb (76.7 kg)   BMI 30.91 kg/m   Body mass index is 30.91 kg/m.  Advanced Directives 10/06/2018 09/16/2017 09/05/2016 07/11/2015  Does Patient Have a Medical Advance Directive? Yes Yes Yes Yes  Type of AParamedicof ATalmoLiving will [place]ent [place]Living will [place]ake LillianLiving will [place]Brooktree ParkLiving will  Copy of HE[place]ain Chart? No - copy requested No - copy requested No - copy requested -    Tobacco Social History   Tobacco Use  Smoking Status Never Smoker  Smokeless Tobacco Never Used  Tobacco Comment   smoking cessation materials not required     Counseling given: No Comment: smoking cessation materials not required   Clinical Intake:  Pre-visit preparation completed: Yes  Pain : 0-10 Pain Score: 1  Pain Type: Chronic pain Pain Location: Back Pain Orientation: Upper Pain Descriptors / Indicators: Aching,  Sore(arthritis) Pain Onset: More than a month ago Pain Frequency: Intermittent     BMI - recorded: 30.91 Nutritional Status: BMI > 30  Obese Nutritional Risks: None Diabetes: No  How often do you need to have someone help you when you read instructions, pamphlets, or other written materials from your doctor or pharmacy?: 1 - Never  Interpreter Needed?: No  Information entered by :: KClemetine MarkerLPN  Past Medical History:  Diagnosis Date  . Allergy   . GERD (gastroesophageal reflux disease)   . Hyperlipidemia   . Hypertension   . PMR (polymyalgia rheumatica) ([place] 09/05/2016   Followed by Dr. NVirginia [place]- [facility]; on slow prednisone taper  . Seasonal allergies   . Trochanteric bursitis   . Vertigo   . Vitamin D deficiency    Past Surgical History:  Procedure Laterality Date  . CATARACT EXTRACTION Bilateral   . ESOPHAGOGASTRODUODENOSCOPY  2010   [facility]  . REPLACEMENT TOTAL KNEE BILATERAL Bilateral    Family History  Problem Relation Age of Onset  . Alzheimer's disease Mother   . Heart disease Brother    Social History   Socioeconomic History  . Marital status: Married    Spouse name: Not on file  . Number of children: 0  . Years of education: Not on file  . Highest education level: Associate degree: occupational, tHotel manager or vocational program  Occupational History  . Occupation: Retired  SScientific laboratory technician . Financial resource strain: Not hard at all  . Food insecurity:    Worry: Never true    Inability: Never true  .  Transportation needs:    Medical: No    Non-medical: No  Tobacco Use  . Smoking status: Never Smoker  . Smokeless tobacco: Never Used  . Tobacco comment: smoking cessation materials not required  Substance and Sexual Activity  . Alcohol use: Yes    Alcohol/week: 2.0 standard drinks    Types: 2 Standard drinks or equivalent per week    Comment: one glass of wine per month  . Drug use: No  . Sexual activity: Not Currently  Lifestyle   . Physical activity:    Days per week: 0 days    Minutes per session: 0 min  . Stress: Not at all  Relationships  . Social connections:    Talks on phone: More than three times a week    Gets together: Twice a week    Attends religious service: Never    Active member of club or organization: No    Attends meetings of clubs or organizations: Never    Relationship status: Married  Other Topics Concern  . Not on file  Social History Narrative  . Not on file    Outpatient Encounter Medications as of 10/06/2018  Medication Sig  . aspirin EC 81 MG tablet Take 81 mg by mouth daily.  . Azelastine-Fluticasone (DYMISTA) 137-50 MCG/ACT SUSP Place into the nose daily.   . cholecalciferol (VITAMIN D) 400 units TABS tablet Take 400 Units by mouth.  . conjugated estrogens (PREMARIN) vaginal cream Place 1 Applicatorful vaginally daily.  . fexofenadine (ALLEGRA) 180 MG tablet Take 180 mg by mouth daily.   . hydrochlorothiazide (HYDRODIURIL) 12.5 MG tablet TAKE 1 TABLET BY MOUTH EVERY DAY  . Ibuprofen 200 MG CAPS Take 1 capsule by mouth as needed.   . lansoprazole (PREVACID) 30 MG capsule Take 1 capsule (30 mg total) by mouth 2 (two) times daily before a meal.  . lidocaine (LIDODERM) 5 % Place 1 patch onto the skin daily.   . meloxicam (MOBIC) 7.5 MG tablet TK 1 T PO QD  . multivitamin-lutein (OCUVITE-LUTEIN) CAPS capsule Take 1 capsule by mouth daily.  Marland Kitchen olopatadine (PATANOL) 0.1 % ophthalmic solution Apply to eye.  . meclizine (ANTIVERT) 25 MG tablet Take 25 mg by mouth 3 (three) times daily as needed for dizziness.  . promethazine (PHENERGAN) 12.5 MG tablet Take 1 tablet (12.5 mg total) by mouth every 8 (eight) hours as needed for nausea or vomiting. (Patient not taking: Reported on 10/06/2018)  . [DISCONTINUED] Carbidopa-Levodopa ER (SINEMET CR) 25-100 MG tablet controlled release Take 1 tablet by mouth at bedtime.  . [DISCONTINUED] gabapentin (NEURONTIN) 100 MG capsule Take 100 mg by mouth at  bedtime.   . [DISCONTINUED] ranitidine (ZANTAC) 150 MG tablet Take 1 tablet (150 mg total) by mouth 2 (two) times daily.   No facility-administered encounter medications on file as of 10/06/2018.     Activities of Daily Living In your present state of health, do you have any difficulty performing the following activities: 10/06/2018  Hearing? N  Comment declines hearing aids  Vision? N  Difficulty concentrating or making decisions? N  Walking or climbing stairs? N  Dressing or bathing? N  Doing errands, shopping? N  Preparing Food and eating ? N  Using the Toilet? N  In the past six months, have you accidently leaked urine? N  Do you have problems with loss of bowel control? N  Managing your Medications? N  Managing your Finances? N  Housekeeping or managing your Housekeeping? N  Some recent data  might be hidden    Patient Care Team: Glean Hess, MD as PCP - General (Internal Medicine) Nadene Rubins, DO as Consulting Physician (Physical Medicine and Rehabilitation) Dia Sitter, MD as Consulting Physician (Otolaryngology) San Jetty, MD as Consulting Physician (Gastroenterology) Lavinia Sharps, Marcelle Smiling, MD as Referring Physician (Neurology)    Assessment:   This is a routine wellness examination for Tifanie.  Exercise Activities and Dietary recommendations Current Exercise Habits: The patient does not participate in regular exercise at present, Exercise limited by: orthopedic condition(s)  Goals    . Decrease the likelihood of falling     Plans to go to [facility] physical therapy/exercise programs to help with balance and endurance   Goal Met and she is in medical wellness program.    . DIET - INCREASE WATER INTAKE     Recommend to drink at least 6-8 8oz glasses of water per day.       Fall Risk Fall Risk  10/06/2018 09/23/2017 09/16/2017 09/05/2016 10/22/2015  Falls in the past year? 0 No No No No  Number falls in past yr: 0 - - - -  Injury with Fall? 0 - -  - -  Risk for fall due to : - - Impaired vision;Impaired balance/gait - -  Risk for fall due to: Comment - - walks with cane, wears eyeglasses, vertigo - -  Follow up Falls prevention discussed - - - -   FALL RISK PREVENTION PERTAINING TO THE HOME:  Any stairs in or around the home? Yes  If so, do they handrails? Yes   Home free of loose throw rugs in walkways, pet beds, electrical cords, etc? Yes  Adequate lighting in your home to reduce risk of falls? Yes   ASSISTIVE DEVICES UTILIZED TO PREVENT FALLS:  Life alert? No  Use of a cane, walker or w/c? Yes  Grab bars in the bathroom? Yes  Shower chair or bench in shower? Yes  Elevated toilet seat or a handicapped toilet? No   DME ORDERS:  DME order needed?  No   TIMED UP AND GO:  Was the test performed? No .  Telephonic visit.   Education: Fall risk prevention has been discussed.  Intervention(s) required? No   Depression Screen PHQ 2/9 Scores 10/06/2018 09/23/2017 09/16/2017 09/16/2017  PHQ - 2 Score 0 0 0 0  PHQ- 9 Score 0 0 0 -     Cognitive Function     6CIT Screen 10/06/2018 09/16/2017 09/05/2016  What Year? 0 points 0 points 0 points  What month? 0 points 0 points 0 points  What time? 0 points 0 points 0 points  Count back from 20 0 points 0 points 0 points  Months in reverse 0 points 0 points 0 points  Repeat phrase 0 points 0 points 0 points  Total Score 0 0 0    Immunization History  Administered Date(s) Administered  . Influenza, High Dose Seasonal PF 02/13/2017, 01/11/2018  . Influenza,inj,quad, With Preservative 02/13/2017  . Influenza-Unspecified 03/06/2015, 02/06/2016, 02/20/2017  . Pneumococcal Conjugate-13 02/20/2014  . Pneumococcal Polysaccharide-23 04/23/2015    Qualifies for Shingles Vaccine? Yes  Zostavax completed per patient. Due for Shingrix. Education has been provided regarding the importance of this vaccine. Pt has been advised to call insurance company to determine out of pocket expense.  Advised may also receive vaccine at local pharmacy or Health Dept. Verbalized acceptance and understanding.  Tdap: Although this vaccine is not a covered service during a Wellness Exam, does the  patient still wish to receive this vaccine today?  No .  Education has been provided regarding the importance of this vaccine. Advised may receive this vaccine at local pharmacy or Health Dept. Aware to provide a copy of the vaccination record if obtained from local pharmacy or Health Dept. Verbalized acceptance and understanding.  Flu Vaccine: Up to date  Pneumococcal Vaccine: Up to date   Screening Tests Health Maintenance  Topic Date Due  . COLONOSCOPY  05/06/2018  . TETANUS/TDAP  05/06/2019 (Originally 08/14/1964)  . INFLUENZA VACCINE  12/04/2018  . MAMMOGRAM  05/25/2019  . DEXA SCAN  Completed  . Hepatitis C Screening  Completed  . PNA vac Low Risk Adult  Completed    Cancer Screenings:  Colorectal Screening: Completed 2010. Repeat every 10 years. Referral to GI placed today to Dr. San Jetty. Pt aware the office will call re: appt.  Mammogram: Completed 05/24/18. Repeat every year.  Bone Density: Completed 06/30/14. Results reflect OSTEOPENIA. Repeat every 2 years. Pt would like to postpone screening to next year.   Lung Cancer Screening: (Low Dose CT Chest recommended if Age 90-80 years, 30 pack-year currently smoking OR have quit w/in 15years.) does not qualify.   Additional Screening:  Hepatitis C Screening: does qualify; Completed 04/23/15  Vision Screening: Recommended annual ophthalmology exams for early detection of glaucoma and other disorders of the eye. Is the patient up to date with their annual eye exam?  Yes  Who is the provider or what is the name of the office in which the pt attends annual eye exams? Dr. Becky Augusta  Dental Screening: Recommended annual dental exams for proper oral hygiene  Community Resource Referral:  CRR required this visit?  No      Plan:     I have personally reviewed and addressed the Medicare Annual Wellness questionnaire and have noted the following in the patient's chart:  A. Medical and social history B. Use of alcohol, tobacco or illicit drugs  C. Current medications and supplements D. Functional ability and status E.  Nutritional status F.  Physical activity G. Advance directives H. List of other physicians I.  Hospitalizations, surgeries, and ER visits in previous 12 months J.  [place] such as hearing and vision if needed, cognitive and depression L. Referrals and appointments   In addition, I have reviewed and discussed with patient certain preventive protocols, quality metrics, and best practice recommendations. A written personalized care plan for preventive services as well as general preventive health recommendations were provided to patient.   Signed,  Clemetine Marker, LPN Nurse Health Advisor   Nurse Notes:none

## 2018-10-06 NOTE — Patient Instructions (Signed)
Barbara West , Thank you for taking time to come for your Medicare Wellness Visit. I appreciate your ongoing commitment to your health goals. Please review the following plan we discussed and let me know if I can assist you in the future.   Screening recommendations/referrals: Colonoscopy: done 2010. Referral sent to Dr. San Jetty for repeat screening colonoscopy. Mammogram: done 05/24/18 Bone Density: done 06/30/14 Recommended yearly ophthalmology/optometry visit for glaucoma screening and checkup Recommended yearly dental visit for hygiene and checkup  Vaccinations: Influenza vaccine: done 01/11/18 Pneumococcal vaccine: done 04/23/15 Tdap vaccine: due - please contact us if you get a cut or scrape Shingles vaccine: Shingrix discussed. Please contact your pharmacy for coverage information.   Advanced directives: Please bring a copy of your health care power of attorney and living will to the office at your convenience.  Conditions/risks identified: Recommend increasing physical activity  Next appointment: Please follow up in one year for your Medicare Annual Wellness visit.     Preventive Care 48 Years and Older, Female Preventive care refers to lifestyle choices and visits with your health care provider that can promote health and wellness. What does preventive care include?  A yearly physical exam. This is also called an annual well check.  Dental exams once or twice a year.  Routine eye exams. Ask your health care provider how often you should have your eyes checked.  Personal lifestyle choices, including:  Daily care of your teeth and gums.  Regular physical activity.  Eating a healthy diet.  Avoiding tobacco and drug use.  Limiting alcohol use.  Practicing safe sex.  Taking low-dose aspirin every day.  Taking vitamin and mineral supplements as recommended by your health care provider. What happens during an annual well check? The services and screenings done by  your health care provider during your annual well check will depend on your age, overall health, lifestyle risk factors, and family history of disease. Counseling  Your health care provider may ask you questions about your:  Alcohol use.  Tobacco use.  Drug use.  Emotional well-being.  Home and relationship well-being.  Sexual activity.  Eating habits.  History of falls.  Memory and ability to understand (cognition).  Work and work Statistician.  Reproductive health. Screening  You may have the following tests or measurements:  Height, weight, and BMI.  Blood pressure.  Lipid and cholesterol levels. These may be checked every 5 years, or more frequently if you are over 8 years old.  Skin check.  Lung cancer screening. You may have this screening every year starting at age 22 if you have a 30-pack-year history of smoking and currently smoke or have quit within the past 15 years.  Fecal occult blood test (FOBT) of the stool. You may have this test every year starting at age 59.  Flexible sigmoidoscopy or colonoscopy. You may have a sigmoidoscopy every 5 years or a colonoscopy every 10 years starting at age 50.  Hepatitis C blood test.  Hepatitis B blood test.  Sexually transmitted disease (STD) testing.  Diabetes screening. This is done by checking your blood sugar (glucose) after you have not eaten for a while (fasting). You may have this done every 1-3 years.  Bone density scan. This is done to screen for osteoporosis. You may have this done starting at age 17.  Mammogram. This may be done every 1-2 years. Talk to your health care provider about how often you should have regular mammograms. Talk with your health care provider about  your test results, treatment options, and if necessary, the need for more tests. Vaccines  Your health care provider may recommend certain vaccines, such as:  Influenza vaccine. This is recommended every year.  Tetanus,  diphtheria, and acellular pertussis (Tdap, Td) vaccine. You may need a Td booster every 10 years.  Zoster vaccine. You may need this after age 32.  Pneumococcal 13-valent conjugate (PCV13) vaccine. One dose is recommended after age 30.  Pneumococcal polysaccharide (PPSV23) vaccine. One dose is recommended after age 31. Talk to your health care provider about which screenings and vaccines you need and how often you need them. This information is not intended to replace advice given to you by your health care provider. Make sure you discuss any questions you have with your health care provider. Document Released: 05/18/2015 Document Revised: 01/09/2016 Document Reviewed: 02/20/2015 Elsevier Interactive Patient Education  2017 [place] Prevention in the Home Falls can cause injuries. They can happen to people of all ages. There are many things you can do to make your home safe and to help prevent falls. What can I do on the outside of my home?  Regularly fix the edges of walkways and driveways and fix any cracks.  Remove anything that might make you trip as you walk through a door, such as a raised step or threshold.  Trim any bushes or trees on the path to your home.  Use bright outdoor lighting.  Clear any walking paths of anything that might make someone trip, such as rocks or tools.  Regularly check to see if handrails are loose or broken. Make sure that both sides of any steps have handrails.  Any raised decks and porches should have guardrails on the edges.  Have any leaves, snow, or ice cleared regularly.  Use sand or salt on walking paths during winter.  Clean up any spills in your garage right away. This includes oil or grease spills. What can I do in the bathroom?  Use night lights.  Install grab bars by the toilet and in the tub and shower. Do not use towel bars as grab bars.  Use non-skid mats or decals in the tub or shower.  If you need to sit down in  the shower, use a plastic, non-slip stool.  Keep the floor dry. Clean up any water that spills on the floor as soon as it happens.  Remove soap buildup in the tub or shower regularly.  Attach bath mats securely with double-sided non-slip rug tape.  Do not have throw rugs and other things on the floor that can make you trip. What can I do in the bedroom?  Use night lights.  Make sure that you have a light by your bed that is easy to reach.  Do not use any sheets or blankets that are too big for your bed. They should not hang down onto the floor.  Have a firm chair that has side arms. You can use this for support while you get dressed.  Do not have throw rugs and other things on the floor that can make you trip. What can I do in the kitchen?  Clean up any spills right away.  Avoid walking on wet floors.  Keep items that you use a lot in easy-to-reach places.  If you need to reach something above you, use a strong step stool that has a grab bar.  Keep electrical cords out of the way.  Do not use floor polish or wax  that makes floors slippery. If you must use wax, use non-skid floor wax.  Do not have throw rugs and other things on the floor that can make you trip. What can I do with my stairs?  Do not leave any items on the stairs.  Make sure that there are handrails on both sides of the stairs and use them. Fix handrails that are broken or loose. Make sure that handrails are as long as the stairways.  Check any carpeting to make sure that it is firmly attached to the stairs. Fix any carpet that is loose or worn.  Avoid having throw rugs at the top or bottom of the stairs. If you do have throw rugs, attach them to the floor with carpet tape.  Make sure that you have a light switch at the top of the stairs and the bottom of the stairs. If you do not have them, ask someone to add them for you. What else can I do to help prevent falls?  Wear shoes that:  Do not have high  heels.  Have rubber bottoms.  Are comfortable and fit you well.  Are closed at the toe. Do not wear sandals.  If you use a stepladder:  Make sure that it is fully opened. Do not climb a closed stepladder.  Make sure that both sides of the stepladder are locked into place.  Ask someone to hold it for you, if possible.  Clearly mark and make sure that you can see:  Any grab bars or handrails.  First and last steps.  Where the edge of each step is.  Use tools that help you move around (mobility aids) if they are needed. These include:  Canes.  Walkers.  Scooters.  Crutches.  Turn on the lights when you go into a dark area. Replace any light bulbs as soon as they burn out.  Set up your furniture so you have a clear path. Avoid moving your furniture around.  If any of your floors are uneven, fix them.  If there are any pets around you, be aware of where they are.  Review your medicines with your doctor. Some medicines can make you feel dizzy. This can increase your chance of falling. Ask your doctor what other things that you can do to help prevent falls. This information is not intended to replace advice given to you by your health care provider. Make sure you discuss any questions you have with your health care provider. Document Released: 02/15/2009 Document Revised: 09/27/2015 Document Reviewed: 05/26/2014 Elsevier Interactive Patient Education  2017 [employer].

## 2018-10-20 ENCOUNTER — Encounter: Payer: Self-pay | Admitting: Internal Medicine

## 2018-12-22 DIAGNOSIS — M47816 Spondylosis without myelopathy or radiculopathy, lumbar region: Secondary | ICD-10-CM | POA: Insufficient documentation

## 2019-01-28 ENCOUNTER — Encounter: Payer: Medicare Other | Admitting: Internal Medicine

## 2019-02-16 ENCOUNTER — Ambulatory Visit (INDEPENDENT_AMBULATORY_CARE_PROVIDER_SITE_OTHER): Payer: Medicare Other | Admitting: Internal Medicine

## 2019-02-16 ENCOUNTER — Other Ambulatory Visit: Payer: Self-pay

## 2019-02-16 ENCOUNTER — Encounter: Payer: Self-pay | Admitting: Internal Medicine

## 2019-02-16 VITALS — BP 136/78 | HR 97 | Ht 62.0 in | Wt 179.0 lb

## 2019-02-16 DIAGNOSIS — Z23 Encounter for immunization: Secondary | ICD-10-CM

## 2019-02-16 DIAGNOSIS — E782 Mixed hyperlipidemia: Secondary | ICD-10-CM

## 2019-02-16 DIAGNOSIS — K2100 Gastro-esophageal reflux disease with esophagitis, without bleeding: Secondary | ICD-10-CM

## 2019-02-16 DIAGNOSIS — Z0000 Encounter for general adult medical examination without abnormal findings: Secondary | ICD-10-CM

## 2019-02-16 DIAGNOSIS — I10 Essential (primary) hypertension: Secondary | ICD-10-CM | POA: Diagnosis not present

## 2019-02-16 DIAGNOSIS — Z1211 Encounter for screening for malignant neoplasm of colon: Secondary | ICD-10-CM | POA: Diagnosis not present

## 2019-02-16 LAB — POCT URINALYSIS DIPSTICK
Bilirubin, UA: NEGATIVE
Blood, UA: NEGATIVE
Glucose, UA: NEGATIVE
Ketones, UA: NEGATIVE
Leukocytes, UA: NEGATIVE
Nitrite, UA: NEGATIVE
Protein, UA: NEGATIVE
Spec Grav, UA: 1.015 (ref 1.010–1.025)
Urobilinogen, UA: 0.2 E.U./dL
pH, UA: 6.5 (ref 5.0–8.0)

## 2019-02-16 NOTE — Progress Notes (Signed)
Date:  02/16/2019   Name:  Barbara West   DOB:  Sep 23, 1945   MRN:  [id number]   Chief Complaint: Annual Exam (Breast Exam. No pap. High dose flu shot.) Barbara West is a 73 y.o. female who presents today for her Complete Annual Exam. She feels well. She reports exercising walking very little. She reports she is sleeping well.   Hypertension This is a chronic problem. The problem is controlled. Pertinent negatives include no chest pain, headaches, palpitations or shortness of breath. Past treatments include diuretics. The current treatment provides significant improvement.  Gastroesophageal Reflux She reports no abdominal pain, no chest pain, no coughing or no wheezing. This is a recurrent problem. The problem occurs occasionally (main sx is queazy stomach - no nausea or vomiting). Pertinent negatives include no fatigue. The treatment provided moderate relief.  Hyperlipidemia This is a chronic problem. Recent lipid tests were reviewed and are high. Pertinent negatives include no chest pain or shortness of breath. She is currently on no antihyperlipidemic treatment. Risk factors for coronary artery disease include hypertension and a sedentary lifestyle.    Review of Systems  Constitutional: Negative for chills, fatigue and fever.  HENT: Negative for congestion, hearing loss, tinnitus, trouble swallowing and voice change.   Eyes: Negative for visual disturbance.  Respiratory: Negative for cough, chest tightness, shortness of breath and wheezing.   Cardiovascular: Negative for chest pain, palpitations and leg swelling.  Gastrointestinal: Negative for abdominal pain, constipation, diarrhea and vomiting.  Endocrine: Negative for polydipsia and polyuria.  Genitourinary: Negative for dysuria, frequency, genital sores, vaginal bleeding and vaginal discharge.  Musculoskeletal: Positive for arthralgias and gait problem. Negative for joint swelling.  Skin: Negative for color change and rash.   Neurological: Negative for dizziness, tremors, light-headedness and headaches.  Hematological: Negative for adenopathy. Does not bruise/bleed easily.  Psychiatric/Behavioral: Negative for dysphoric mood and sleep disturbance. The patient is not nervous/anxious.     Patient Active Problem List   Diagnosis Date Noted  . 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 Disability of walking 12/15/2016  . Trochanteric bursitis of both hips 09/05/2016  . 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 Avitaminosis D 01/22/2015  . Allergic rhinitis, seasonal 01/22/2015  . Hyperlipidemia 01/22/2015  . 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 Essential (primary) hypertension 01/22/2015  . Gastroesophageal reflux disease with esophagitis 01/22/2015    Allergies  Allergen Reactions  . Celecoxib Anaphylaxis  . Atorvastatin   . Clarithromycin Other (See Comments)  . Codeine Other (See Comments), Nausea Only and Nausea And Vomiting  . Losartan Potassium Other (See Comments)  . Morphine And Related Other (See Comments), Nausea Only and Nausea And Vomiting  . Shellfish Allergy Nausea Only  . Sulfa Antibiotics Nausea And Vomiting  . Mupirocin Rash    Cream/ointment.  . Penicillins Rash    Past Surgical History:  Procedure Laterality Date  . CATARACT EXTRACTION Bilateral   . ESOPHAGOGASTRODUODENOSCOPY  2010   [facility]  . REPLACEMENT TOTAL KNEE BILATERAL Bilateral     Social History   Tobacco Use  . Smoking status: Never Smoker  . Smokeless tobacco: Never Used  . Tobacco comment: smoking cessation materials not required  Substance Use Topics  . Alcohol use: Yes    Alcohol/week: 2.0 standard drinks    Types: 2 Standard drinks or equivalent per week    Comment: one glass of wine per month  . Drug use: No     Medication list  has been reviewed and updated.  Current Meds  Medication Sig  . aspirin EC 81 MG tablet Take 81 mg by mouth daily.  .  Azelastine-Fluticasone (DYMISTA) 137-50 MCG/ACT SUSP Place into the nose daily.   . cholecalciferol (VITAMIN D) 400 units TABS tablet Take 400 Units by mouth.  . conjugated estrogens (PREMARIN) vaginal cream Place 1 Applicatorful vaginally daily.  . fexofenadine (ALLEGRA) 180 MG tablet Take 180 mg by mouth daily.   . hydrochlorothiazide (HYDRODIURIL) 12.5 MG tablet TAKE 1 TABLET BY MOUTH EVERY DAY  . Ibuprofen 200 MG CAPS Take 1 capsule by mouth as needed.   . lansoprazole (PREVACID) 30 MG capsule Take 1 capsule (30 mg total) by mouth 2 (two) times daily before a meal.  . lidocaine (LIDODERM) 5 % Place 1 patch onto the skin daily.   . meclizine (ANTIVERT) 25 MG tablet Take 25 mg by mouth 3 (three) times daily as needed for dizziness.  . meloxicam (MOBIC) 7.5 MG tablet TK 1 T PO QD  . multivitamin-lutein (OCUVITE-LUTEIN) CAPS capsule Take 1 capsule by mouth daily.  Marland Kitchen olopatadine (PATANOL) 0.1 % ophthalmic solution Apply to eye.    PHQ 2/9 Scores 02/16/2019 10/06/2018 09/23/2017 09/16/2017  PHQ - 2 Score 0 0 0 0  PHQ- 9 Score - 0 0 0    BP Readings from Last 3 Encounters:  02/16/19 136/78  10/06/18 121/76  09/23/17 121/81    Physical Exam Vitals signs and nursing note reviewed.  Constitutional:      General: She is not in acute distress.    Appearance: She is well-developed.  HENT:     Head: Normocephalic and atraumatic.     Right Ear: Tympanic membrane and ear canal normal.     Left Ear: Tympanic membrane and ear canal normal.     Nose:     Right Sinus: No maxillary sinus tenderness.     Left Sinus: No maxillary sinus tenderness.  Eyes:     General: No scleral icterus.       Right eye: No discharge.        Left eye: No discharge.     Conjunctiva/sclera: Conjunctivae normal.  Neck:     Musculoskeletal: Normal range of motion. No erythema.     Thyroid: No thyromegaly.     Vascular: No carotid bruit.  Cardiovascular:     Rate and Rhythm: Normal rate and regular rhythm.      Pulses: Normal pulses.     Heart sounds: Normal heart sounds.  Pulmonary:     Effort: Pulmonary effort is normal. No respiratory distress.     Breath sounds: No wheezing.  Chest:     Breasts:        Right: No mass, nipple discharge, skin change or tenderness.        Left: No mass, nipple discharge, skin change or tenderness.  Abdominal:     General: Bowel sounds are normal.     Palpations: Abdomen is soft.     Tenderness: There is no abdominal tenderness.  Musculoskeletal:     Right hip: She exhibits decreased range of motion.     Right knee: She exhibits no swelling and no effusion.     Left knee: She exhibits no swelling and no effusion.  Lymphadenopathy:     Cervical: No cervical adenopathy.  Skin:    General: Skin is warm and dry.     Findings: No rash.  Neurological:     Mental Status: She is alert and  oriented to person, place, and time.     Cranial Nerves: No cranial nerve deficit.     Sensory: No sensory deficit.     Deep Tendon Reflexes: Reflexes are normal and symmetric.  Psychiatric:        Speech: Speech normal.        Behavior: Behavior normal.        Thought Content: Thought content normal.     Wt Readings from Last 3 Encounters:  02/16/19 179 lb (81.2 kg)  10/06/18 169 lb (76.7 kg)  09/23/17 176 lb (79.8 kg)    BP 136/78   Pulse 97   Ht 5\' 2"  (1.575 m)   Wt 179 lb (81.2 kg)   SpO2 96%   BMI 32.74 kg/m   Assessment and Plan: 1. Annual physical exam Normal exam except for weight Continue to work on diet - POCT urinalysis dipstick  2. Encounter for screening for malignant neoplasm of colon To be scheduled at [facility] - [facility] 3D SCREEN BREAST BILATERAL; Future  3. Essential (primary) hypertension Clinically stable exam with well controlled BP.   Tolerating medications, hctz 12.5 mg, without side effects at this time. Pt to continue current regimen and low sodium diet; benefits of regular exercise as able discussed. - Comprehensive metabolic panel -  TSH  4. Gastroesophageal reflux disease with esophagitis without hemorrhage Symptoms well controlled on daily PPI No red flag signs such as weight loss, n/v, melena.  Intermittent queazy stomach for unclear reasons Will continue Prevacid bid. - CBC with Differential/Platelet  5. Mixed hyperlipidemia Not currently on therapy with no CAD hx - Lipid panel  6. Colon cancer screening Due for 10 yr colonoscopy - Ambulatory referral to Gastroenterology   Partially dictated using Dragon software. Any errors are unintentional.  Halina Maidens, MD [facility]  02/16/2019

## 2019-02-17 LAB — CBC WITH DIFFERENTIAL/PLATELET
Basophils Absolute: 0.1 10*3/uL (ref 0.0–0.2)
Basos: 1 %
EOS (ABSOLUTE): 0.5 10*3/uL — ABNORMAL HIGH (ref 0.0–0.4)
Eos: 4 %
Hematocrit: 41.2 % (ref 34.0–46.6)
Hemoglobin: 14.2 g/dL (ref 11.1–15.9)
Immature Grans (Abs): 0 10*3/uL (ref 0.0–0.1)
Immature Granulocytes: 0 %
Lymphocytes Absolute: 3.7 10*3/uL — ABNORMAL HIGH (ref 0.7–3.1)
Lymphs: 34 %
MCH: 29.4 pg (ref 26.6–33.0)
MCHC: 34.5 g/dL (ref 31.5–35.7)
MCV: 85 fL (ref 79–97)
Monocytes Absolute: 1.7 10*3/uL — ABNORMAL HIGH (ref 0.1–0.9)
Monocytes: 15 %
Neutrophils Absolute: 5.1 10*3/uL (ref 1.4–7.0)
Neutrophils: 46 %
Platelets: 410 10*3/uL (ref 150–450)
RBC: 4.83 x10E6/uL (ref 3.77–5.28)
RDW: 12.7 % (ref 11.7–15.4)
WBC: 11.1 10*3/uL — ABNORMAL HIGH (ref 3.4–10.8)

## 2019-02-17 LAB — COMPREHENSIVE METABOLIC PANEL
ALT: 9 IU/L (ref 0–32)
AST: 14 IU/L (ref 0–40)
Albumin/Globulin Ratio: 1.7 (ref 1.2–2.2)
Albumin: 4.2 g/dL (ref 3.7–4.7)
Alkaline Phosphatase: 113 IU/L (ref 39–117)
BUN/Creatinine Ratio: 9 — ABNORMAL LOW (ref 12–28)
BUN: 9 mg/dL (ref 8–27)
Bilirubin Total: 0.3 mg/dL (ref 0.0–1.2)
CO2: 24 mmol/L (ref 20–29)
Calcium: 9.4 mg/dL (ref 8.7–10.3)
Chloride: 97 mmol/L (ref 96–106)
Creatinine, Ser: 1.05 mg/dL — ABNORMAL HIGH (ref 0.57–1.00)
GFR calc Af Amer: 61 mL/min/{1.73_m2} (ref >59)
GFR calc non Af Amer: 53 mL/min/{1.73_m2} — ABNORMAL LOW (ref >59)
Globulin, Total: 2.5 g/dL (ref 1.5–4.5)
Glucose: 94 mg/dL (ref 65–99)
Potassium: 4.1 mmol/L (ref 3.5–5.2)
Sodium: 134 mmol/L (ref 134–144)
Total Protein: 6.7 g/dL (ref 6.0–8.5)

## 2019-02-17 LAB — LIPID PANEL
Chol/HDL Ratio: 4.5 ratio — ABNORMAL HIGH (ref 0.0–4.4)
Cholesterol, Total: 300 mg/dL — ABNORMAL HIGH (ref 100–199)
HDL: 66 mg/dL (ref >39)
LDL Chol Calc (NIH): 188 mg/dL — ABNORMAL HIGH (ref 0–99)
Triglycerides: 241 mg/dL — ABNORMAL HIGH (ref 0–149)
VLDL Cholesterol Cal: 46 mg/dL — ABNORMAL HIGH (ref 5–40)

## 2019-02-17 LAB — TSH: TSH: 1.86 u[IU]/mL (ref 0.450–4.500)

## 2019-03-03 ENCOUNTER — Encounter: Payer: Self-pay | Admitting: Internal Medicine

## 2019-03-24 LAB — HM COLONOSCOPY

## 2019-05-11 ENCOUNTER — Other Ambulatory Visit: Payer: Self-pay | Admitting: Internal Medicine

## 2019-05-11 DIAGNOSIS — K2100 Gastro-esophageal reflux disease with esophagitis, without bleeding: Secondary | ICD-10-CM

## 2019-05-18 ENCOUNTER — Encounter: Payer: Self-pay | Admitting: Internal Medicine

## 2019-05-19 DIAGNOSIS — M25512 Pain in left shoulder: Secondary | ICD-10-CM | POA: Diagnosis not present

## 2019-05-19 DIAGNOSIS — M7601 Gluteal tendinitis, right hip: Secondary | ICD-10-CM | POA: Diagnosis not present

## 2019-05-23 DIAGNOSIS — M7061 Trochanteric bursitis, right hip: Secondary | ICD-10-CM | POA: Diagnosis not present

## 2019-06-01 DIAGNOSIS — Z01818 Encounter for other preprocedural examination: Secondary | ICD-10-CM | POA: Diagnosis not present

## 2019-06-01 DIAGNOSIS — M7061 Trochanteric bursitis, right hip: Secondary | ICD-10-CM | POA: Diagnosis not present

## 2019-06-06 HISTORY — PX: HIP SURGERY: SHX245

## 2019-06-08 DIAGNOSIS — H353211 Exudative age-related macular degeneration, right eye, with active choroidal neovascularization: Secondary | ICD-10-CM | POA: Diagnosis not present

## 2019-06-08 DIAGNOSIS — H353122 Nonexudative age-related macular degeneration, left eye, intermediate dry stage: Secondary | ICD-10-CM | POA: Diagnosis not present

## 2019-06-10 DIAGNOSIS — M65311 Trigger thumb, right thumb: Secondary | ICD-10-CM | POA: Diagnosis not present

## 2019-06-13 DIAGNOSIS — Z01812 Encounter for preprocedural laboratory examination: Secondary | ICD-10-CM | POA: Diagnosis not present

## 2019-06-13 DIAGNOSIS — Z20822 Contact with and (suspected) exposure to covid-19: Secondary | ICD-10-CM | POA: Diagnosis not present

## 2019-06-14 DIAGNOSIS — M1610 Unilateral primary osteoarthritis, unspecified hip: Secondary | ICD-10-CM | POA: Diagnosis not present

## 2019-06-14 DIAGNOSIS — K219 Gastro-esophageal reflux disease without esophagitis: Secondary | ICD-10-CM | POA: Diagnosis not present

## 2019-06-14 DIAGNOSIS — I10 Essential (primary) hypertension: Secondary | ICD-10-CM | POA: Diagnosis not present

## 2019-06-14 DIAGNOSIS — Z888 Allergy status to other drugs, medicaments and biological substances status: Secondary | ICD-10-CM | POA: Diagnosis not present

## 2019-06-14 DIAGNOSIS — M7061 Trochanteric bursitis, right hip: Secondary | ICD-10-CM | POA: Diagnosis not present

## 2019-06-14 DIAGNOSIS — M7631 Iliotibial band syndrome, right leg: Secondary | ICD-10-CM | POA: Diagnosis not present

## 2019-06-14 DIAGNOSIS — E559 Vitamin D deficiency, unspecified: Secondary | ICD-10-CM | POA: Diagnosis not present

## 2019-06-14 DIAGNOSIS — Z882 Allergy status to sulfonamides status: Secondary | ICD-10-CM | POA: Diagnosis not present

## 2019-06-14 DIAGNOSIS — M1710 Unilateral primary osteoarthritis, unspecified knee: Secondary | ICD-10-CM | POA: Diagnosis not present

## 2019-06-21 DIAGNOSIS — M6281 Muscle weakness (generalized): Secondary | ICD-10-CM | POA: Diagnosis not present

## 2019-06-21 DIAGNOSIS — M25551 Pain in right hip: Secondary | ICD-10-CM | POA: Diagnosis not present

## 2019-06-21 DIAGNOSIS — R269 Unspecified abnormalities of gait and mobility: Secondary | ICD-10-CM | POA: Diagnosis not present

## 2019-06-28 DIAGNOSIS — M25551 Pain in right hip: Secondary | ICD-10-CM | POA: Diagnosis not present

## 2019-06-28 DIAGNOSIS — M6281 Muscle weakness (generalized): Secondary | ICD-10-CM | POA: Diagnosis not present

## 2019-06-28 DIAGNOSIS — R269 Unspecified abnormalities of gait and mobility: Secondary | ICD-10-CM | POA: Diagnosis not present

## 2019-07-06 DIAGNOSIS — M6281 Muscle weakness (generalized): Secondary | ICD-10-CM | POA: Diagnosis not present

## 2019-07-06 DIAGNOSIS — M25551 Pain in right hip: Secondary | ICD-10-CM | POA: Diagnosis not present

## 2019-07-06 DIAGNOSIS — R269 Unspecified abnormalities of gait and mobility: Secondary | ICD-10-CM | POA: Diagnosis not present

## 2019-07-13 DIAGNOSIS — M25551 Pain in right hip: Secondary | ICD-10-CM | POA: Diagnosis not present

## 2019-07-13 DIAGNOSIS — M6281 Muscle weakness (generalized): Secondary | ICD-10-CM | POA: Diagnosis not present

## 2019-07-13 DIAGNOSIS — R269 Unspecified abnormalities of gait and mobility: Secondary | ICD-10-CM | POA: Diagnosis not present

## 2019-07-20 DIAGNOSIS — H353211 Exudative age-related macular degeneration, right eye, with active choroidal neovascularization: Secondary | ICD-10-CM | POA: Diagnosis not present

## 2019-07-20 DIAGNOSIS — H43813 Vitreous degeneration, bilateral: Secondary | ICD-10-CM | POA: Diagnosis not present

## 2019-07-20 DIAGNOSIS — H353122 Nonexudative age-related macular degeneration, left eye, intermediate dry stage: Secondary | ICD-10-CM | POA: Diagnosis not present

## 2019-07-21 DIAGNOSIS — R269 Unspecified abnormalities of gait and mobility: Secondary | ICD-10-CM | POA: Diagnosis not present

## 2019-07-21 DIAGNOSIS — M6281 Muscle weakness (generalized): Secondary | ICD-10-CM | POA: Diagnosis not present

## 2019-07-21 DIAGNOSIS — M25551 Pain in right hip: Secondary | ICD-10-CM | POA: Diagnosis not present

## 2019-07-27 DIAGNOSIS — G5602 Carpal tunnel syndrome, left upper limb: Secondary | ICD-10-CM | POA: Diagnosis not present

## 2019-07-27 DIAGNOSIS — M654 Radial styloid tenosynovitis [de Quervain]: Secondary | ICD-10-CM | POA: Diagnosis not present

## 2019-07-27 DIAGNOSIS — G5601 Carpal tunnel syndrome, right upper limb: Secondary | ICD-10-CM | POA: Diagnosis not present

## 2019-07-28 DIAGNOSIS — M6281 Muscle weakness (generalized): Secondary | ICD-10-CM | POA: Diagnosis not present

## 2019-07-28 DIAGNOSIS — R269 Unspecified abnormalities of gait and mobility: Secondary | ICD-10-CM | POA: Diagnosis not present

## 2019-07-28 DIAGNOSIS — M25551 Pain in right hip: Secondary | ICD-10-CM | POA: Diagnosis not present

## 2019-08-09 DIAGNOSIS — M25551 Pain in right hip: Secondary | ICD-10-CM | POA: Diagnosis not present

## 2019-08-09 DIAGNOSIS — M6281 Muscle weakness (generalized): Secondary | ICD-10-CM | POA: Diagnosis not present

## 2019-08-09 DIAGNOSIS — R269 Unspecified abnormalities of gait and mobility: Secondary | ICD-10-CM | POA: Diagnosis not present

## 2019-08-26 DIAGNOSIS — R269 Unspecified abnormalities of gait and mobility: Secondary | ICD-10-CM | POA: Diagnosis not present

## 2019-08-26 DIAGNOSIS — M25551 Pain in right hip: Secondary | ICD-10-CM | POA: Diagnosis not present

## 2019-08-26 DIAGNOSIS — M6281 Muscle weakness (generalized): Secondary | ICD-10-CM | POA: Diagnosis not present

## 2019-08-28 ENCOUNTER — Other Ambulatory Visit: Payer: Self-pay | Admitting: Internal Medicine

## 2019-08-28 NOTE — Telephone Encounter (Signed)
Requested Prescriptions  Pending Prescriptions Disp Refills  . hydrochlorothiazide (HYDRODIURIL) 12.5 MG tablet [Pharmacy Med Name: HYDROCHLOROTHIAZIDE 12.5MG  TABLETS] 90 tablet 0    Sig: TAKE 1 TABLET BY MOUTH EVERY DAY     Cardiovascular: Diuretics - Thiazide Failed - 08/28/2019  3:46 AM      Failed - Cr in normal range and within 360 days    Creatinine, Ser  Date Value Ref Range Status  02/16/2019 1.05 (H) 0.57 - 1.00 mg/dL Final         Failed - Valid encounter within last 6 months    Recent Outpatient Visits          6 months ago Encounter for screening for malignant neoplasm of colon   [facility] Glean Hess, MD   1 year ago Annual physical exam   [facility] Glean Hess, MD   2 years ago Gastroesophageal reflux disease with esophagitis   [facility] Glean Hess, MD   2 years ago Medicare annual wellness visit, subsequent   [facility] Glean Hess, MD   3 years ago Gastroesophageal reflux disease with esophagitis   [facility] Glean Hess, MD      Future Appointments            In 5 months [facility] Melia Jesse Sans, MD [facility] [place] in normal range and within 360 days    Calcium  Date Value Ref Range Status  02/16/2019 9.4 8.7 - 10.3 mg/dL Final         Passed - K in normal range and within 360 days    Potassium  Date Value Ref Range Status  02/16/2019 4.1 3.5 - 5.2 mmol/L Final         Passed - Na in normal range and within 360 days    Sodium  Date Value Ref Range Status  02/16/2019 134 134 - 144 mmol/L Final         Passed - Last BP in normal range    BP Readings from Last 1 Encounters:  02/16/19 136/78         Courtesy Refill provided due to patient already having a f/u appt scheduled and not being > 3 months over due per protocol.

## 2019-09-06 DIAGNOSIS — M25551 Pain in right hip: Secondary | ICD-10-CM | POA: Diagnosis not present

## 2019-09-06 DIAGNOSIS — R269 Unspecified abnormalities of gait and mobility: Secondary | ICD-10-CM | POA: Diagnosis not present

## 2019-09-06 DIAGNOSIS — M6281 Muscle weakness (generalized): Secondary | ICD-10-CM | POA: Diagnosis not present

## 2019-09-07 DIAGNOSIS — H353211 Exudative age-related macular degeneration, right eye, with active choroidal neovascularization: Secondary | ICD-10-CM | POA: Diagnosis not present

## 2019-09-07 DIAGNOSIS — H353122 Nonexudative age-related macular degeneration, left eye, intermediate dry stage: Secondary | ICD-10-CM | POA: Diagnosis not present

## 2019-09-19 ENCOUNTER — Encounter: Payer: Self-pay | Admitting: Internal Medicine

## 2019-09-19 ENCOUNTER — Other Ambulatory Visit: Payer: Self-pay | Admitting: Internal Medicine

## 2019-09-19 MED ORDER — HYDROCHLOROTHIAZIDE 12.5 MG PO TABS: 12.5000 mg | ORAL_TABLET | Freq: Every day | ORAL | 3 refills | Status: DC

## 2019-09-21 DIAGNOSIS — R269 Unspecified abnormalities of gait and mobility: Secondary | ICD-10-CM | POA: Diagnosis not present

## 2019-09-21 DIAGNOSIS — M6281 Muscle weakness (generalized): Secondary | ICD-10-CM | POA: Diagnosis not present

## 2019-09-21 DIAGNOSIS — M25551 Pain in right hip: Secondary | ICD-10-CM | POA: Diagnosis not present

## 2019-10-04 ENCOUNTER — Telehealth: Payer: Self-pay | Admitting: Internal Medicine

## 2019-10-04 NOTE — Telephone Encounter (Signed)
Left message for patient to call back and schedule Medicare Annual Wellness Visit (AWV) either virtually/audio only or in office. Whichever the patients preference is.  Last AWV 10/06/18; please schedule 10/07/19 OR AFTER with [facility]-Nurse Health Advisor.  This should be a 40 minute visit.

## 2019-11-02 DIAGNOSIS — H353122 Nonexudative age-related macular degeneration, left eye, intermediate dry stage: Secondary | ICD-10-CM | POA: Diagnosis not present

## 2019-11-02 DIAGNOSIS — H353211 Exudative age-related macular degeneration, right eye, with active choroidal neovascularization: Secondary | ICD-10-CM | POA: Diagnosis not present

## 2019-11-15 DIAGNOSIS — L853 Xerosis cutis: Secondary | ICD-10-CM | POA: Diagnosis not present

## 2019-11-15 DIAGNOSIS — S20461A Insect bite (nonvenomous) of right back wall of thorax, initial encounter: Secondary | ICD-10-CM | POA: Diagnosis not present

## 2019-11-15 DIAGNOSIS — Z85828 Personal history of other malignant neoplasm of skin: Secondary | ICD-10-CM | POA: Diagnosis not present

## 2019-11-15 DIAGNOSIS — L570 Actinic keratosis: Secondary | ICD-10-CM | POA: Diagnosis not present

## 2019-11-15 DIAGNOSIS — L820 Inflamed seborrheic keratosis: Secondary | ICD-10-CM | POA: Diagnosis not present

## 2019-11-15 DIAGNOSIS — R21 Rash and other nonspecific skin eruption: Secondary | ICD-10-CM | POA: Diagnosis not present

## 2019-12-06 ENCOUNTER — Encounter: Payer: Self-pay | Admitting: Internal Medicine

## 2019-12-12 ENCOUNTER — Other Ambulatory Visit: Payer: Self-pay | Admitting: Internal Medicine

## 2019-12-12 DIAGNOSIS — J40 Bronchitis, not specified as acute or chronic: Secondary | ICD-10-CM

## 2019-12-12 MED ORDER — ALBUTEROL SULFATE HFA 108 (90 BASE) MCG/ACT IN AERS: 2.0000 | INHALATION_SPRAY | Freq: Four times a day (QID) | RESPIRATORY_TRACT | Status: DC | PRN

## 2019-12-12 MED ORDER — ALBUTEROL SULFATE HFA 108 (90 BASE) MCG/ACT IN AERS: 2.0000 | INHALATION_SPRAY | Freq: Four times a day (QID) | RESPIRATORY_TRACT | 0 refills | Status: AC | PRN

## 2020-01-04 DIAGNOSIS — H353211 Exudative age-related macular degeneration, right eye, with active choroidal neovascularization: Secondary | ICD-10-CM | POA: Diagnosis not present

## 2020-01-04 DIAGNOSIS — H353122 Nonexudative age-related macular degeneration, left eye, intermediate dry stage: Secondary | ICD-10-CM | POA: Diagnosis not present

## 2020-01-06 DIAGNOSIS — Z1231 Encounter for screening mammogram for malignant neoplasm of breast: Secondary | ICD-10-CM | POA: Diagnosis not present

## 2020-01-16 DIAGNOSIS — G5601 Carpal tunnel syndrome, right upper limb: Secondary | ICD-10-CM | POA: Diagnosis not present

## 2020-01-26 DIAGNOSIS — H524 Presbyopia: Secondary | ICD-10-CM | POA: Diagnosis not present

## 2020-01-26 DIAGNOSIS — I10 Essential (primary) hypertension: Secondary | ICD-10-CM | POA: Diagnosis not present

## 2020-01-26 DIAGNOSIS — H04123 Dry eye syndrome of bilateral lacrimal glands: Secondary | ICD-10-CM | POA: Diagnosis not present

## 2020-01-26 DIAGNOSIS — H5213 Myopia, bilateral: Secondary | ICD-10-CM | POA: Diagnosis not present

## 2020-01-26 DIAGNOSIS — H02822 Cysts of right lower eyelid: Secondary | ICD-10-CM | POA: Diagnosis not present

## 2020-01-26 DIAGNOSIS — H52223 Regular astigmatism, bilateral: Secondary | ICD-10-CM | POA: Diagnosis not present

## 2020-01-26 DIAGNOSIS — H25813 Combined forms of age-related cataract, bilateral: Secondary | ICD-10-CM | POA: Diagnosis not present

## 2020-01-26 DIAGNOSIS — H353131 Nonexudative age-related macular degeneration, bilateral, early dry stage: Secondary | ICD-10-CM | POA: Diagnosis not present

## 2020-02-17 ENCOUNTER — Ambulatory Visit (INDEPENDENT_AMBULATORY_CARE_PROVIDER_SITE_OTHER): Payer: Medicare PPO | Admitting: Internal Medicine

## 2020-02-17 ENCOUNTER — Encounter: Payer: Self-pay | Admitting: Internal Medicine

## 2020-02-17 ENCOUNTER — Other Ambulatory Visit: Payer: Self-pay

## 2020-02-17 VITALS — BP 162/84 | HR 83 | Temp 98.1°F | Ht 62.0 in | Wt 176.0 lb

## 2020-02-17 DIAGNOSIS — I10 Essential (primary) hypertension: Secondary | ICD-10-CM

## 2020-02-17 DIAGNOSIS — K2100 Gastro-esophageal reflux disease with esophagitis, without bleeding: Secondary | ICD-10-CM | POA: Diagnosis not present

## 2020-02-17 DIAGNOSIS — E559 Vitamin D deficiency, unspecified: Secondary | ICD-10-CM

## 2020-02-17 DIAGNOSIS — Z0000 Encounter for general adult medical examination without abnormal findings: Secondary | ICD-10-CM

## 2020-02-17 DIAGNOSIS — Z23 Encounter for immunization: Secondary | ICD-10-CM | POA: Diagnosis not present

## 2020-02-17 DIAGNOSIS — E782 Mixed hyperlipidemia: Secondary | ICD-10-CM

## 2020-02-17 LAB — POCT URINALYSIS DIPSTICK
Bilirubin, UA: NEGATIVE
Blood, UA: NEGATIVE
Glucose, UA: NEGATIVE
Ketones, UA: NEGATIVE
Leukocytes, UA: NEGATIVE
Nitrite, UA: NEGATIVE
Protein, UA: NEGATIVE
Spec Grav, UA: 1.015 (ref 1.010–1.025)
Urobilinogen, UA: 0.2 E.U./dL
pH, UA: 5 (ref 5.0–8.0)

## 2020-02-17 MED ORDER — AMLODIPINE BESYLATE 2.5 MG PO TABS: 2.5000 mg | ORAL_TABLET | Freq: Every day | ORAL | 3 refills | Status: DC

## 2020-02-17 NOTE — Progress Notes (Signed)
Date:  02/17/2020   Name:  ARMETTA HENRI   DOB:  1946/01/14   MRN:  [id number]   Chief Complaint: Annual Exam (Breast Exam. No pap- discontinued.) and Flu Vaccine (High dose flu shot.)  KAREENA ARRAMBIDE is a 74 y.o. female who presents today for her Complete Annual Exam. She feels well. She reports exercising - none. She reports she is sleeping well. Breast complaints - none.  Mammogram: 01/2020 DEXA: 05/2010 Colonoscopy: 03/2019  Immunization History  Administered Date(s) Administered  . Fluad Quad(high Dose 65+) 02/16/2019, 02/17/2020  . Influenza, High Dose Seasonal PF 02/13/2017, 01/11/2018  . Influenza,inj,quad, With Preservative 02/13/2017  . Influenza-Unspecified 03/06/2015, 02/06/2016, 02/20/2017  . PFIZER SARS-COV-2 Vaccination 06/17/2019, 07/08/2019  . Pneumococcal Conjugate-13 02/20/2014  . Pneumococcal Polysaccharide-23 04/23/2015    Hypertension This is a chronic problem. The problem is controlled (1/3 of the readings are greater than 150 syst). Pertinent negatives include no chest pain, headaches, palpitations or shortness of breath. Past treatments include diuretics. The current treatment provides significant improvement. There are no compliance problems.   Gastroesophageal Reflux She complains of heartburn. She reports no abdominal pain, no chest pain, no coughing or no wheezing. This is a recurrent problem. The problem occurs occasionally. Pertinent negatives include no fatigue. She has tried a PPI for the symptoms. The treatment provided significant relief.    Lab Results  Component Value Date   CREATININE 1.05 (H) 02/16/2019   BUN 9 02/16/2019   NA 134 02/16/2019   K 4.1 02/16/2019   CL 97 02/16/2019   CO2 24 02/16/2019   Lab Results  Component Value Date   CHOL 300 (H) 02/16/2019   HDL 66 02/16/2019   LDLCALC 188 (H) 02/16/2019   TRIG 241 (H) 02/16/2019   CHOLHDL 4.5 (H) 02/16/2019   Lab Results  Component Value Date   TSH 1.860 02/16/2019   No  results found for: HGBA1C Lab Results  Component Value Date   WBC 11.1 (H) 02/16/2019   HGB 14.2 02/16/2019   HCT 41.2 02/16/2019   MCV 85 02/16/2019   PLT 410 02/16/2019   Lab Results  Component Value Date   ALT 9 02/16/2019   AST 14 02/16/2019   ALKPHOS 113 02/16/2019   BILITOT 0.3 02/16/2019  Last vitamin D No results found for: 25OHVITD2, 25OHVITD3, VD25OH     Review of Systems  Constitutional: Negative for chills, fatigue and fever.  HENT: Negative for congestion, hearing loss, tinnitus, trouble swallowing and voice change.   Eyes: Negative for visual disturbance.  Respiratory: Negative for cough, chest tightness, shortness of breath and wheezing.   Cardiovascular: Negative for chest pain, palpitations and leg swelling.  Gastrointestinal: Positive for heartburn. Negative for abdominal pain, constipation, diarrhea and vomiting.  Endocrine: Negative for polydipsia and polyuria.  Genitourinary: Positive for frequency. Negative for dysuria, genital sores, vaginal bleeding and vaginal discharge.  Musculoskeletal: Positive for arthralgias. Negative for gait problem and joint swelling.  Skin: Negative for color change and rash.  Neurological: Negative for dizziness, tremors, light-headedness and headaches.  Hematological: Negative for adenopathy. Does not bruise/bleed easily.  Psychiatric/Behavioral: Negative for dysphoric mood and sleep disturbance. The patient is not nervous/anxious.     Patient Active Problem List   Diagnosis Date Noted  . Lumbar spondylosis 12/22/2018  . 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 Disability of walking 12/15/2016  . Trochanteric bursitis of both hips 09/05/2016  . 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 Avitaminosis D 01/22/2015  . Allergic rhinitis, seasonal 01/22/2015  .  Hyperlipidemia 01/22/2015  . 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 Essential (primary) hypertension 01/22/2015  . Gastroesophageal reflux disease with esophagitis  01/22/2015    Allergies  Allergen Reactions  . Celecoxib Anaphylaxis  . Atorvastatin   . Clarithromycin Other (See Comments)  . Codeine Other (See Comments), Nausea Only and Nausea And Vomiting  . Losartan Potassium Other (See Comments)  . Morphine And Related Other (See Comments), Nausea Only and Nausea And Vomiting  . Shellfish Allergy Nausea Only  . Sulfa Antibiotics Nausea And Vomiting  . Mupirocin Rash    Cream/ointment.  . Penicillins Rash    Past Surgical History:  Procedure Laterality Date  . CATARACT EXTRACTION Bilateral   . ESOPHAGOGASTRODUODENOSCOPY  2010   [facility]  . REPLACEMENT TOTAL KNEE BILATERAL Bilateral     Social History   Tobacco Use  . Smoking status: Never Smoker  . Smokeless tobacco: Never Used  . Tobacco comment: smoking cessation materials not required  Vaping Use  . Vaping Use: Never used  Substance Use Topics  . Alcohol use: Yes    Alcohol/week: 2.0 standard drinks    Types: 2 Standard drinks or equivalent per week    Comment: one glass of wine per month  . Drug use: No     Medication list has been reviewed and updated.  Current Meds  Medication Sig  . albuterol (VENTOLIN HFA) 108 (90 Base) MCG/ACT inhaler Inhale 2 puffs into the lungs every 6 (six) hours as needed for wheezing or shortness of breath.  Marland Kitchen aspirin EC 81 MG tablet Take 81 mg by mouth daily.  . Azelastine-Fluticasone (DYMISTA) 137-50 MCG/ACT SUSP Place into the nose daily.   . cholecalciferol (VITAMIN D) 400 units TABS tablet Take 400 Units by mouth.  . conjugated estrogens (PREMARIN) vaginal cream Place 1 Applicatorful vaginally daily.  . fexofenadine (ALLEGRA) 180 MG tablet Take 180 mg by mouth daily.   . fluocinonide cream (LIDEX) 0.05 %   . hydrochlorothiazide (HYDRODIURIL) 12.5 MG tablet Take 1 tablet (12.5 mg total) by mouth daily.  . Ibuprofen 200 MG CAPS Take 1 capsule by mouth as needed.   . lansoprazole (PREVACID) 30 MG capsule TAKE 1 CAPSULE BY MOUTH TWICE  DAILY BEFORE A MEAL  . lidocaine (LIDODERM) 5 % Place 1 patch onto the skin daily.   . meclizine (ANTIVERT) 25 MG tablet Take 25 mg by mouth 3 (three) times daily as needed for dizziness.  . meloxicam (MOBIC) 7.5 MG tablet Take 7.5 mg by mouth at bedtime as needed.   . multivitamin-lutein (OCUVITE-LUTEIN) CAPS capsule Take 1 capsule by mouth daily.  Marland Kitchen olopatadine (PATANOL) 0.1 % ophthalmic solution Apply to eye.    PHQ 2/9 Scores 02/17/2020 02/16/2019 10/06/2018 09/23/2017  PHQ - 2 Score 0 0 0 0  PHQ- 9 Score 0 - 0 0    GAD 7 : Generalized Anxiety Score 02/17/2020  Nervous, Anxious, on Edge 0  Control/stop worrying 0  Worry too much - different things 0  Trouble relaxing 0  Restless 0  Easily annoyed or irritable 0  Afraid - awful might happen 0  Total GAD 7 Score 0  Anxiety Difficulty Not difficult at all    BP Readings from Last 3 Encounters:  02/17/20 (!) 162/84  02/16/19 136/78  10/06/18 121/76    Physical Exam Vitals and nursing note reviewed.  Constitutional:      General: She is not in acute distress.    Appearance: She is well-developed.  HENT:  Head: Normocephalic and atraumatic.     Right Ear: Tympanic membrane and ear canal normal.     Left Ear: Tympanic membrane and ear canal normal.     Nose:     Right Sinus: No maxillary sinus tenderness.     Left Sinus: No maxillary sinus tenderness.  Eyes:     General: No scleral icterus.       Right eye: No discharge.        Left eye: No discharge.     Conjunctiva/sclera: Conjunctivae normal.  Neck:     Thyroid: No thyromegaly.     Vascular: No carotid bruit.  Cardiovascular:     Rate and Rhythm: Normal rate and regular rhythm.     Pulses: Normal pulses.     Heart sounds: Normal heart sounds.  Pulmonary:     Effort: Pulmonary effort is normal. No respiratory distress.     Breath sounds: No wheezing.  Abdominal:     General: Bowel sounds are normal.     Palpations: Abdomen is soft.     Tenderness: There is  no abdominal tenderness.  Musculoskeletal:     Cervical back: Normal range of motion. No erythema.     Right lower leg: No edema.     Left lower leg: No edema.  Lymphadenopathy:     Cervical: No cervical adenopathy.  Skin:    General: Skin is warm and dry.     Findings: No rash.  Neurological:     Mental Status: She is alert and oriented to person, place, and time.     Cranial Nerves: No cranial nerve deficit.     Sensory: No sensory deficit.     Deep Tendon Reflexes: Reflexes are normal and symmetric.  Psychiatric:        Attention and Perception: Attention normal.        Mood and Affect: Mood normal.     Wt Readings from Last 3 Encounters:  02/17/20 176 lb (79.8 kg)  02/16/19 179 lb (81.2 kg)  10/06/18 169 lb (76.7 kg)    BP (!) 162/84   Pulse 83   Temp 98.1 F (36.7 C) (Oral)   Ht 5\' 2"  (1.575 m)   Wt 176 lb (79.8 kg)   SpO2 96%   BMI 32.19 kg/m   Assessment and Plan: 1. Annual physical exam Exam is normal except for weight. Encourage exercise as able and appropriate dietary changes. - POCT urinalysis dipstick  2. Essential (primary) hypertension BP is not controlled on hctz alone Will add low dose amlodipine.  Pt will monitor BP at home and call if not controlled or medication side effects occur - CBC with Differential/Platelet - Comprehensive metabolic panel - TSH - amLODipine (NORVASC) 2.5 MG tablet; Take 1 tablet (2.5 mg total) by mouth daily.  Dispense: 90 tablet; Refill: 3  3. Gastroesophageal reflux disease with esophagitis without hemorrhage Symptoms well controlled on daily PPI No red flag signs such as weight loss, n/v, melena Will continue pantoprazole 1-2 times per day. - CBC with Differential/Platelet  4. Mixed hyperlipidemia - Lipid panel  5. Avitaminosis D Continue daily supplementation - VITAMIN D 25 Hydroxy (Vit-D Deficiency, Fractures)  6. Need for immunization against influenza - Flu Vaccine QUAD High Dose(Fluad)   Partially  dictated using Editor, commissioning. Any errors are unintentional.  Halina Maidens, MD [facility]  02/17/2020

## 2020-02-18 ENCOUNTER — Encounter: Payer: Self-pay | Admitting: Internal Medicine

## 2020-02-18 LAB — COMPREHENSIVE METABOLIC PANEL
ALT: 7 IU/L (ref 0–32)
AST: 14 IU/L (ref 0–40)
Albumin/Globulin Ratio: 1.6 (ref 1.2–2.2)
Albumin: 4.2 g/dL (ref 3.7–4.7)
Alkaline Phosphatase: 103 IU/L (ref 44–121)
BUN/Creatinine Ratio: 14 (ref 12–28)
BUN: 11 mg/dL (ref 8–27)
Bilirubin Total: 0.3 mg/dL (ref 0.0–1.2)
CO2: 22 mmol/L (ref 20–29)
Calcium: 9.8 mg/dL (ref 8.7–10.3)
Chloride: 98 mmol/L (ref 96–106)
Creatinine, Ser: 0.79 mg/dL (ref 0.57–1.00)
GFR calc Af Amer: 85 mL/min/{1.73_m2} (ref >59)
GFR calc non Af Amer: 74 mL/min/{1.73_m2} (ref >59)
Globulin, Total: 2.6 g/dL (ref 1.5–4.5)
Glucose: 94 mg/dL (ref 65–99)
Potassium: 4.1 mmol/L (ref 3.5–5.2)
Sodium: 135 mmol/L (ref 134–144)
Total Protein: 6.8 g/dL (ref 6.0–8.5)

## 2020-02-18 LAB — LIPID PANEL
Chol/HDL Ratio: 4 ratio (ref 0.0–4.4)
Cholesterol, Total: 299 mg/dL — ABNORMAL HIGH (ref 100–199)
HDL: 74 mg/dL (ref >39)
LDL Chol Calc (NIH): 186 mg/dL — ABNORMAL HIGH (ref 0–99)
Triglycerides: 208 mg/dL — ABNORMAL HIGH (ref 0–149)
VLDL Cholesterol Cal: 39 mg/dL (ref 5–40)

## 2020-02-18 LAB — CBC WITH DIFFERENTIAL/PLATELET
Basophils Absolute: 0.1 10*3/uL (ref 0.0–0.2)
Basos: 1 %
EOS (ABSOLUTE): 0.5 10*3/uL — ABNORMAL HIGH (ref 0.0–0.4)
Eos: 5 %
Hematocrit: 42.9 % (ref 34.0–46.6)
Hemoglobin: 14.4 g/dL (ref 11.1–15.9)
Immature Grans (Abs): 0.1 10*3/uL (ref 0.0–0.1)
Immature Granulocytes: 1 %
Lymphocytes Absolute: 4.1 10*3/uL — ABNORMAL HIGH (ref 0.7–3.1)
Lymphs: 37 %
MCH: 28.7 pg (ref 26.6–33.0)
MCHC: 33.6 g/dL (ref 31.5–35.7)
MCV: 86 fL (ref 79–97)
Monocytes Absolute: 1.4 10*3/uL — ABNORMAL HIGH (ref 0.1–0.9)
Monocytes: 13 %
Neutrophils Absolute: 4.8 10*3/uL (ref 1.4–7.0)
Neutrophils: 43 %
Platelets: 399 10*3/uL (ref 150–450)
RBC: 5.01 x10E6/uL (ref 3.77–5.28)
RDW: 12.9 % (ref 11.7–15.4)
WBC: 11 10*3/uL — ABNORMAL HIGH (ref 3.4–10.8)

## 2020-02-18 LAB — VITAMIN D 25 HYDROXY (VIT D DEFICIENCY, FRACTURES): Vit D, 25-Hydroxy: 39.5 ng/mL (ref 30.0–100.0)

## 2020-02-18 LAB — TSH: TSH: 1.84 u[IU]/mL (ref 0.450–4.500)

## 2020-02-19 ENCOUNTER — Other Ambulatory Visit: Payer: Self-pay | Admitting: Internal Medicine

## 2020-02-19 DIAGNOSIS — I10 Essential (primary) hypertension: Secondary | ICD-10-CM

## 2020-02-19 MED ORDER — AMLODIPINE BESYLATE 2.5 MG PO TABS: 2.5000 mg | ORAL_TABLET | Freq: Every day | ORAL | 3 refills | Status: DC

## 2020-02-23 DIAGNOSIS — M654 Radial styloid tenosynovitis [de Quervain]: Secondary | ICD-10-CM | POA: Diagnosis not present

## 2020-02-23 DIAGNOSIS — G5601 Carpal tunnel syndrome, right upper limb: Secondary | ICD-10-CM | POA: Diagnosis not present

## 2020-03-05 ENCOUNTER — Ambulatory Visit: Payer: Medicare PPO

## 2020-03-07 DIAGNOSIS — H353231 Exudative age-related macular degeneration, bilateral, with active choroidal neovascularization: Secondary | ICD-10-CM | POA: Diagnosis not present

## 2020-03-12 ENCOUNTER — Ambulatory Visit (INDEPENDENT_AMBULATORY_CARE_PROVIDER_SITE_OTHER): Payer: Medicare PPO

## 2020-03-12 DIAGNOSIS — Z0000 Encounter for general adult medical examination without abnormal findings: Secondary | ICD-10-CM

## 2020-03-12 NOTE — Patient Instructions (Signed)
Barbara West , Thank you for taking time to come for your Medicare Wellness Visit. I appreciate your ongoing commitment to your health goals. Please review the following plan we discussed and let me know if I can assist you in the future.   Screening recommendations/referrals: Colonoscopy: done 03/24/19. Repeat in 2025 Mammogram: done 01/06/20 Bone Density: done 06/30/14 Recommended yearly ophthalmology/optometry visit for glaucoma screening and checkup Recommended yearly dental visit for hygiene and checkup  Vaccinations: Influenza vaccine: done 02/17/20 Pneumococcal vaccine: done 04/23/15 Tdap vaccine: due Shingles vaccine: Shingrix discussed. Please contact your pharmacy for coverage information.  Covid-19: done 06/17/19 & 07/08/19   Advanced directives: Please bring a copy of your health care power of attorney and living will to the office at your convenience.  Conditions/risks identified: Recommend increasing physical activity as tolerated  Next appointment: Follow up in one year for your annual wellness visit    Preventive Care 65 Years and Older, Female Preventive care refers to lifestyle choices and visits with your health care provider that can promote health and wellness. What does preventive care include?  A yearly physical exam. This is also called an annual well check.  Dental exams once or twice a year.  Routine eye exams. Ask your health care provider how often you should have your eyes checked.  Personal lifestyle choices, including:  Daily care of your teeth and gums.  Regular physical activity.  Eating a healthy diet.  Avoiding tobacco and drug use.  Limiting alcohol use.  Practicing safe sex.  Taking low-dose aspirin every day.  Taking vitamin and mineral supplements as recommended by your health care provider. What happens during an annual well check? The services and screenings done by your health care provider during your annual well check will depend  on your age, overall health, lifestyle risk factors, and family history of disease. Counseling  Your health care provider may ask you questions about your:  Alcohol use.  Tobacco use.  Drug use.  Emotional well-being.  Home and relationship well-being.  Sexual activity.  Eating habits.  History of falls.  Memory and ability to understand (cognition).  Work and work Statistician.  Reproductive health. Screening  You may have the following tests or measurements:  Height, weight, and BMI.  Blood pressure.  Lipid and cholesterol levels. These may be checked every 5 years, or more frequently if you are over 39 years old.  Skin check.  Lung cancer screening. You may have this screening every year starting at age 45 if you have a 30-pack-year history of smoking and currently smoke or have quit within the past 15 years.  Fecal occult blood test (FOBT) of the stool. You may have this test every year starting at age 63.  Flexible sigmoidoscopy or colonoscopy. You may have a sigmoidoscopy every 5 years or a colonoscopy every 10 years starting at age 23.  Hepatitis C blood test.  Hepatitis B blood test.  Sexually transmitted disease (STD) testing.  Diabetes screening. This is done by checking your blood sugar (glucose) after you have not eaten for a while (fasting). You may have this done every 1-3 years.  Bone density scan. This is done to screen for osteoporosis. You may have this done starting at age 74.  Mammogram. This may be done every 1-2 years. Talk to your health care provider about how often you should have regular mammograms. Talk with your health care provider about your test results, treatment options, and if necessary, the need for more tests.  Vaccines  Your health care provider may recommend certain vaccines, such as:  Influenza vaccine. This is recommended every year.  Tetanus, diphtheria, and acellular pertussis (Tdap, Td) vaccine. You may need a Td  booster every 10 years.  Zoster vaccine. You may need this after age 55.  Pneumococcal 13-valent conjugate (PCV13) vaccine. One dose is recommended after age 60.  Pneumococcal polysaccharide (PPSV23) vaccine. One dose is recommended after age 62. Talk to your health care provider about which screenings and vaccines you need and how often you need them. This information is not intended to replace advice given to you by your health care provider. Make sure you discuss any questions you have with your health care provider. Document Released: 05/18/2015 Document Revised: 01/09/2016 Document Reviewed: 02/20/2015 Elsevier Interactive Patient Education  2017 [place] Prevention in the Home Falls can cause injuries. They can happen to people of all ages. There are many things you can do to make your home safe and to help prevent falls. What can I do on the outside of my home?  Regularly fix the edges of walkways and driveways and fix any cracks.  Remove anything that might make you trip as you walk through a door, such as a raised step or threshold.  Trim any bushes or trees on the path to your home.  Use bright outdoor lighting.  Clear any walking paths of anything that might make someone trip, such as rocks or tools.  Regularly check to see if handrails are loose or broken. Make sure that both sides of any steps have handrails.  Any raised decks and porches should have guardrails on the edges.  Have any leaves, snow, or ice cleared regularly.  Use sand or salt on walking paths during winter.  Clean up any spills in your garage right away. This includes oil or grease spills. What can I do in the bathroom?  Use night lights.  Install grab bars by the toilet and in the tub and shower. Do not use towel bars as grab bars.  Use non-skid mats or decals in the tub or shower.  If you need to sit down in the shower, use a plastic, non-slip stool.  Keep the floor dry. Clean up  any water that spills on the floor as soon as it happens.  Remove soap buildup in the tub or shower regularly.  Attach bath mats securely with double-sided non-slip rug tape.  Do not have throw rugs and other things on the floor that can make you trip. What can I do in the bedroom?  Use night lights.  Make sure that you have a light by your bed that is easy to reach.  Do not use any sheets or blankets that are too big for your bed. They should not hang down onto the floor.  Have a firm chair that has side arms. You can use this for support while you get dressed.  Do not have throw rugs and other things on the floor that can make you trip. What can I do in the kitchen?  Clean up any spills right away.  Avoid walking on wet floors.  Keep items that you use a lot in easy-to-reach places.  If you need to reach something above you, use a strong step stool that has a grab bar.  Keep electrical cords out of the way.  Do not use floor polish or wax that makes floors slippery. If you must use wax, use non-skid floor wax.  Do not have throw rugs and other things on the floor that can make you trip. What can I do with my stairs?  Do not leave any items on the stairs.  Make sure that there are handrails on both sides of the stairs and use them. Fix handrails that are broken or loose. Make sure that handrails are as long as the stairways.  Check any carpeting to make sure that it is firmly attached to the stairs. Fix any carpet that is loose or worn.  Avoid having throw rugs at the top or bottom of the stairs. If you do have throw rugs, attach them to the floor with carpet tape.  Make sure that you have a light switch at the top of the stairs and the bottom of the stairs. If you do not have them, ask someone to add them for you. What else can I do to help prevent falls?  Wear shoes that:  Do not have high heels.  Have rubber bottoms.  Are comfortable and fit you well.  Are  closed at the toe. Do not wear sandals.  If you use a stepladder:  Make sure that it is fully opened. Do not climb a closed stepladder.  Make sure that both sides of the stepladder are locked into place.  Ask someone to hold it for you, if possible.  Clearly mark and make sure that you can see:  Any grab bars or handrails.  First and last steps.  Where the edge of each step is.  Use tools that help you move around (mobility aids) if they are needed. These include:  Canes.  Walkers.  Scooters.  Crutches.  Turn on the lights when you go into a dark area. Replace any light bulbs as soon as they burn out.  Set up your furniture so you have a clear path. Avoid moving your furniture around.  If any of your floors are uneven, fix them.  If there are any pets around you, be aware of where they are.  Review your medicines with your doctor. Some medicines can make you feel dizzy. This can increase your chance of falling. Ask your doctor what other things that you can do to help prevent falls. This information is not intended to replace advice given to you by your health care provider. Make sure you discuss any questions you have with your health care provider. Document Released: 02/15/2009 Document Revised: 09/27/2015 Document Reviewed: 05/26/2014 Elsevier Interactive Patient Education  2017 [employer].

## 2020-03-12 NOTE — Progress Notes (Signed)
Subjective:   Barbara West is a 74 y.o. female who presents for Medicare Annual (Subsequent) preventive examination.  Virtual Visit via Telephone Note  I connected with  Barbara West on 03/12/20 at 10:00 AM EST by telephone and verified that I am speaking with the correct person using two identifiers.  Medicare Annual Wellness visit completed telephonically due to Covid-19 pandemic.   Location: Patient: home Provider: [facility]   I discussed the limitations, risks, security and privacy concerns of performing an evaluation and management service by telephone and the availability of in person appointments. The patient expressed understanding and agreed to proceed.  Unable to perform video visit due to video visit attempted and failed and/or patient does not have video capability.   Some vital signs may be absent or patient reported.   Clemetine Marker, LPN    Review of Systems     Cardiac Risk Factors include: advanced age (>37mn, >>62women);dyslipidemia;hypertension;obesity (BMI >30kg/m2)     Objective:    There were no vitals filed for this visit. There is no height or weight on file to calculate BMI.  Advanced Directives 03/12/2020 10/06/2018 09/16/2017 09/05/2016 07/11/2015  Does Patient Have a Medical Advance Directive? _0   Type of AParamedicof APueblitosLiving will [place]Living will [place]Living will [place] CityLiving will [place]Living will  Copy of HAguilarin Chart? No - copy requested No - copy requested No - copy requested No - copy requested -    Current Medications (verified) Outpatient Encounter Medications as of 03/12/2020  Medication Sig  . amLODipine (NORVASC) 2.5 MG tablet Take 1 tablet (2.5 mg total) by mouth daily.  .Marland Kitchenaspirin EC 81 MG tablet Take 81 mg by mouth daily.  . Azelastine-Fluticasone (DYMISTA) 137-50 MCG/ACT SUSP  Place into the nose daily.   . cholecalciferol (VITAMIN D) 400 units TABS tablet Take 400 Units by mouth.  . conjugated estrogens (PREMARIN) vaginal cream Place 1 Applicatorful vaginally daily.  . fexofenadine (ALLEGRA) 180 MG tablet Take 180 mg by mouth daily.   . fluocinonide cream (LIDEX) 0.05 %   . hydrochlorothiazide (HYDRODIURIL) 12.5 MG tablet Take 1 tablet (12.5 mg total) by mouth daily.  . Ibuprofen 200 MG CAPS Take 1 capsule by mouth as needed.   . lansoprazole (PREVACID) 30 MG capsule TAKE 1 CAPSULE BY MOUTH TWICE DAILY BEFORE A MEAL (Patient taking differently: Take 30 mg by mouth daily at 12 noon. )  . lidocaine (LIDODERM) 5 % Place 1 patch onto the skin daily.   . meclizine (ANTIVERT) 25 MG tablet Take 25 mg by mouth 3 (three) times daily as needed for dizziness.  . meloxicam (MOBIC) 7.5 MG tablet Take 7.5 mg by mouth at bedtime as needed.   . multivitamin-lutein (OCUVITE-LUTEIN) CAPS capsule Take 1 capsule by mouth daily.  .Marland Kitchenolopatadine (PATANOL) 0.1 % ophthalmic solution Apply to eye.  .Marland Kitchenalbuterol (VENTOLIN HFA) 108 (90 Base) MCG/ACT inhaler Inhale 2 puffs into the lungs every 6 (six) hours as needed for wheezing or shortness of breath. (Patient not taking: Reported on 03/12/2020)   No facility-administered encounter medications on file as of 03/12/2020.    Allergies (verified) Celecoxib, Atorvastatin, Clarithromycin, Codeine, Losartan potassium, Morphine and related, Shellfish allergy, Sulfa antibiotics, Mupirocin, and Penicillins   History: Past Medical History:  Diagnosis Date  . Allergy   . GERD (gastroesophageal reflux disease)   . Hyperlipidemia   .  Hypertension   . PMR (polymyalgia rheumatica) ([place]) 09/05/2016   Followed by Dr. Virginia Rochester - [facility]; on slow prednisone taper  . Seasonal allergies   . Trochanteric bursitis   . Vertigo   . Vitamin D deficiency    Past Surgical History:  Procedure Laterality Date  . CATARACT EXTRACTION Bilateral   .  ESOPHAGOGASTRODUODENOSCOPY  2010   [place]  . HIP SURGERY Right 06/2019  . REPLACEMENT TOTAL KNEE BILATERAL Bilateral    Family History  Problem Relation Age of Onset  . Alzheimer's disease Mother   . Heart disease Brother    Social History   Socioeconomic History  . Marital status: Married    Spouse name: Not on file  . Number of children: 0  . Years of education: Not on file  . Highest education level: Associate degree: occupational, Hotel manager, or vocational program  Occupational History  . Occupation: Retired  Tobacco Use  . Smoking status: Never Smoker  . Smokeless tobacco: Never Used  . Tobacco comment: smoking cessation materials not required  Vaping Use  . Vaping Use: Never used  Substance and Sexual Activity  . Alcohol use: Yes    Alcohol/week: 2.0 standard drinks    Types: 2 Standard drinks or equivalent per week    Comment: one glass of wine per month  . Drug use: No  . Sexual activity: Not Currently  Other Topics Concern  . Not on file  Social History Narrative  . Not on file   Social Determinants of Health   Financial Resource Strain: Low Risk   . Difficulty of Paying Living Expenses: Not hard at all  Food Insecurity: No Food Insecurity  . Worried About Charity fundraiser in the Last Year: Never true  . Ran Out of Food in the Last Year: Never true  Transportation Needs: No Transportation Needs  . Lack of Transportation (Medical): No  . Lack of Transportation (Non-Medical): No  Physical Activity: Inactive  . Days of Exercise per Week: 0 days  . Minutes of Exercise per Session: 0 min  Stress: No Stress Concern Present  . Feeling of Stress : Not at all  Social Connections: Moderately Isolated  . Frequency of Communication with Friends and Family: More than three times a week  . Frequency of Social Gatherings with Friends and Family: Twice a week  . Attends Religious Services: Never  . Active Member of Clubs or Organizations: No  . Attends Theatre manager Meetings: Never  . Marital Status: Married    Tobacco Counseling Counseling given: Not Answered Comment: smoking cessation materials not required   Clinical Intake:  Pre-visit preparation completed: Yes  Pain : No/denies pain     Nutritional Risks: None Diabetes: No  How often do you need to have someone help you when you read instructions, pamphlets, or other written materials from your doctor or pharmacy?: 1 - Never    Interpreter Needed?: No  Information entered by :: Clemetine Marker LPN   Activities of Daily Living In your present state of health, do you have any difficulty performing the following activities: 03/12/2020 02/17/2020  Hearing? N N  Comment declines hearing aids -  Vision? N N  Difficulty concentrating or making decisions? N N  Walking or climbing stairs? N N  Dressing or bathing? N N  Doing errands, shopping? N N  Preparing Food and eating ? N -  Using the Toilet? N -  In the past six months, have you accidently leaked  urine? N -  Do you have problems with loss of bowel control? N -  Managing your Medications? N -  Managing your Finances? N -  Housekeeping or managing your Housekeeping? N -  Some recent data might be hidden    Patient Care Team: Glean Hess, MD as PCP - General (Internal Medicine) Nadene Rubins, DO as Consulting Physician (Physical Medicine and Rehabilitation) Dia Sitter, MD as Consulting Physician (Otolaryngology) San Jetty, MD as Consulting Physician (Gastroenterology) Lavinia Sharps, Marcelle Smiling, MD as Referring Physician (Neurology)  Indicate any recent Medical Services you may have received from other than [facility] providers in the past year (date may be approximate).     Assessment:   This is a routine wellness examination for Kathi.  Hearing/Vision screen  Hearing Screening   125Hz 250Hz 500Hz 1000Hz 2000Hz 3000Hz 4000Hz 6000Hz 8000Hz  Right ear:           Left ear:             Comments: Pt denies hearing difficulty  Vision Screening Comments: Annual vision screenings with Dr. Becky [facility]a in [facility]  Dietary issues and exercise activities discussed: Current Exercise Habits: The patient does not participate in regular exercise at present, Exercise limited by: orthopedic condition(s)  Goals    . Decrease the likelihood of falling     Plans to go to [facility] physical therapy/exercise programs to help with balance and endurance   Goal Met and she is in medical wellness program.    . DIET - INCREASE WATER INTAKE     Recommend to drink at least 6-8 8oz glasses of water per day.      Depression Screen PHQ 2/9 Scores 03/12/2020 02/17/2020 02/16/2019 10/06/2018 09/23/2017 09/16/2017 09/16/2017  PHQ - 2 Score 0 0 0 0 0 0 0  PHQ- 9 Score - 0 - 0 0 0 -    Fall Risk Fall Risk  03/12/2020 02/17/2020 02/16/2019 10/06/2018 09/23/2017  Falls in the past year? 0 0 0 0 No  Number falls in past yr: 0 0 0 0 -  Injury with Fall? 0 0 0 0 -  Risk for fall due to : No Fall Risks History of fall(s);Impaired balance/gait Impaired balance/gait;Impaired mobility;History of fall(s) - -  Risk for fall due to: Comment - - - - -  Follow up Falls prevention discussed Falls evaluation completed Falls evaluation completed;Falls prevention discussed Falls prevention discussed -    Any stairs in or around the home? Yes  If so, are there any without handrails? No  Home free of loose throw rugs in walkways, pet beds, electrical cords, etc? Yes  Adequate lighting in your home to reduce risk of falls? Yes   ASSISTIVE DEVICES UTILIZED TO PREVENT FALLS:  Life alert? No  Use of a cane, walker or w/c? No  Grab bars in the bathroom? No  Shower chair or bench in shower? Yes  Elevated toilet seat or a handicapped toilet? No   TIMED UP AND GO:  Was the test performed? No . Telephonic visit.   Cognitive Function: 6CIT deferred for 2021 AWV; pt states no memory issues     6CIT Screen 10/06/2018 09/16/2017  09/05/2016  What Year? 0 points 0 points 0 points  What month? 0 points 0 points 0 points  What time? 0 points 0 points 0 points  Count back from 20 0 points 0 points 0 points  Months in reverse 0 points 0 points 0 points  Repeat phrase 0 points  0 points 0 points  Total Score 0 0 0    Immunizations Immunization History  Administered Date(s) Administered  . Fluad Quad(high Dose 65+) 02/16/2019, 02/17/2020  . Influenza, High Dose Seasonal PF 02/13/2017, 01/11/2018  . Influenza,inj,quad, With Preservative 02/13/2017  . Influenza-Unspecified 03/06/2015, 02/06/2016, 02/20/2017  . PFIZER SARS-COV-2 Vaccination 06/17/2019, 07/08/2019  . Pneumococcal Conjugate-13 02/20/2014  . Pneumococcal Polysaccharide-23 04/23/2015    TDAP status: Due, Education has been provided regarding the importance of this vaccine. Advised may receive this vaccine at local pharmacy or Health Dept. Aware to provide a copy of the vaccination record if obtained from local pharmacy or Health Dept. Verbalized acceptance and understanding.   Flu Vaccine status: Up to date   Pneumococcal vaccine status: Up to date   Covid-19 vaccine status: Completed vaccines  Qualifies for Shingles Vaccine? Yes   Zostavax completed No   Shingrix Completed?: No.    Education has been provided regarding the importance of this vaccine. Patient has been advised to call insurance company to determine out of pocket expense if they have not yet received this vaccine. Advised may also receive vaccine at local pharmacy or Health Dept. Verbalized acceptance and understanding.  Screening Tests Health Maintenance  Topic Date Due  . TETANUS/TDAP  Never done  . MAMMOGRAM  01/05/2021  . COLONOSCOPY  03/23/2024  . INFLUENZA VACCINE  Completed  . DEXA SCAN  Completed  . COVID-19 Vaccine  Completed  . Hepatitis C Screening  Completed  . PNA vac Low Risk Adult  Completed    Health Maintenance  Health Maintenance Due  Topic Date Due  .  TETANUS/TDAP  Never done    Colorectal cancer screening: Completed 03/24/19. Repeat every 5 years   Mammogram status: Completed 01/06/20. Repeat every year   Bone Density status: Completed 06/30/14. Results reflect: Bone density results: OSTEOPENIA. Repeat every 2 years. pt requests to postpone at this time.   Lung Cancer Screening: (Low Dose CT Chest recommended if Age 63-80 years, 30 pack-year currently smoking OR have quit w/in 15years.) does not qualify.   Additional Screening:  Hepatitis C Screening: does qualify; Completed 04/23/15  Vision Screening: Recommended annual ophthalmology exams for early detection of glaucoma and other disorders of the eye. Is the patient up to date with their annual eye exam?  Yes  Who is the provider or what is the name of the office in which the patient attends annual eye exams? Dr. Becky Augusta   Dental Screening: Recommended annual dental exams for proper oral hygiene  Community Resource Referral / Chronic Care Management: CRR required this visit?  No   CCM required this visit?  No      Plan:     I have personally reviewed and noted the following in the patient's chart:   . Medical and social history . Use of alcohol, tobacco or illicit drugs  . Current medications and supplements . Functional ability and status . Nutritional status . Physical activity . Advanced directives . List of other physicians . Hospitalizations, surgeries, and ER visits in previous 12 months . Vitals . Screenings to include cognitive, depression, and falls . Referrals and appointments  In addition, I have reviewed and discussed with patient certain preventive protocols, quality metrics, and best practice recommendations. A written personalized care plan for preventive services as well as general preventive health recommendations were provided to patient.     Clemetine Marker, LPN   29/01/3715   Nurse Notes: none

## 2020-03-15 DIAGNOSIS — Z01818 Encounter for other preprocedural examination: Secondary | ICD-10-CM | POA: Diagnosis not present

## 2020-03-15 DIAGNOSIS — G5601 Carpal tunnel syndrome, right upper limb: Secondary | ICD-10-CM | POA: Diagnosis not present

## 2020-03-23 DIAGNOSIS — G5601 Carpal tunnel syndrome, right upper limb: Secondary | ICD-10-CM | POA: Diagnosis not present

## 2020-04-09 DIAGNOSIS — H353221 Exudative age-related macular degeneration, left eye, with active choroidal neovascularization: Secondary | ICD-10-CM | POA: Diagnosis not present

## 2020-04-09 DIAGNOSIS — H353231 Exudative age-related macular degeneration, bilateral, with active choroidal neovascularization: Secondary | ICD-10-CM | POA: Diagnosis not present

## 2020-04-18 ENCOUNTER — Telehealth: Payer: Self-pay

## 2020-04-18 NOTE — Telephone Encounter (Signed)
Last BP at her October appt was elevated so called patient to scheduled BP follow up. Scheduled appt for Jan 3rd - first available for patient after the holidays.

## 2020-05-07 ENCOUNTER — Other Ambulatory Visit: Payer: Self-pay

## 2020-05-07 ENCOUNTER — Encounter: Payer: Self-pay | Admitting: Internal Medicine

## 2020-05-07 ENCOUNTER — Ambulatory Visit: Payer: Medicare PPO | Admitting: Internal Medicine

## 2020-05-07 VITALS — BP 138/68 | HR 94 | Temp 98.3°F | Ht 62.0 in | Wt 180.0 lb

## 2020-05-07 DIAGNOSIS — I10 Essential (primary) hypertension: Secondary | ICD-10-CM

## 2020-05-07 MED ORDER — HYDROCHLOROTHIAZIDE 25 MG PO TABS: 25.0000 mg | ORAL_TABLET | Freq: Every day | ORAL | 1 refills | Status: DC

## 2020-05-07 NOTE — Progress Notes (Signed)
Date:  05/07/2020   Name:  Barbara West   DOB:  May 18, 1945   MRN:  [id number]   Chief Complaint: Hypertension (Gum sensitivity, skin peeling on hands, constipation)  Hypertension This is a chronic problem. The problem is uncontrolled (at home 3/4 of readings are less than 140/80). Pertinent negatives include no chest pain, headaches, palpitations or shortness of breath. Past treatments include calcium channel blockers and diuretics (amlodipine added last visit). The current treatment provides moderate improvement. Compliance problems include medication side effects (constipation, gum sensitivity and peeling skin).     Lab Results  Component Value Date   CREATININE 0.79 02/17/2020   BUN 11 02/17/2020   NA 135 02/17/2020   K 4.1 02/17/2020   CL 98 02/17/2020   CO2 22 02/17/2020   Lab Results  Component Value Date   CHOL 299 (H) 02/17/2020   HDL 74 02/17/2020   LDLCALC 186 (H) 02/17/2020   TRIG 208 (H) 02/17/2020   CHOLHDL 4.0 02/17/2020   Lab Results  Component Value Date   TSH 1.840 02/17/2020   No results found for: HGBA1C Lab Results  Component Value Date   WBC 11.0 (H) 02/17/2020   HGB 14.4 02/17/2020   HCT 42.9 02/17/2020   MCV 86 02/17/2020   PLT 399 02/17/2020   Lab Results  Component Value Date   ALT 7 02/17/2020   AST 14 02/17/2020   ALKPHOS 103 02/17/2020   BILITOT 0.3 02/17/2020     Review of Systems  Constitutional: Negative for chills and fever.  HENT: Positive for mouth sores (gum pain but no bleeding).   Respiratory: Negative for cough, chest tightness and shortness of breath.   Cardiovascular: Negative for chest pain, palpitations and leg swelling.  Gastrointestinal: Positive for constipation. Negative for blood in stool.  Neurological: Negative for facial asymmetry and headaches.  Psychiatric/Behavioral: Negative for dysphoric mood and sleep disturbance.    Patient Active Problem List   Diagnosis Date Noted  . Lumbar spondylosis  12/22/2018  . 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 Disability of walking 12/15/2016  . Trochanteric bursitis of both hips 09/05/2016  . 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 Avitaminosis D 01/22/2015  . Allergic rhinitis, seasonal 01/22/2015  . Hyperlipidemia 01/22/2015  . 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 Essential (primary) hypertension 01/22/2015  . Gastroesophageal reflux disease with esophagitis 01/22/2015    Allergies  Allergen Reactions  . Celecoxib Anaphylaxis  . Atorvastatin   . Clarithromycin Other (See Comments)  . Codeine Other (See Comments), Nausea Only and Nausea And Vomiting  . Losartan Potassium Other (See Comments)  . Morphine And Related Other (See Comments), Nausea Only and Nausea And Vomiting  . Shellfish Allergy Nausea Only  . Sulfa Antibiotics Nausea And Vomiting  . Mupirocin Rash    Cream/ointment.  . Penicillins Rash    Past Surgical History:  Procedure Laterality Date  . CATARACT EXTRACTION Bilateral   . ESOPHAGOGASTRODUODENOSCOPY  2010   [place]  . HIP SURGERY Right 06/2019  . REPLACEMENT TOTAL KNEE BILATERAL Bilateral     Social History   Tobacco Use  . Smoking status: Never Smoker  . Smokeless tobacco: Never Used  . Tobacco comment: smoking cessation materials not required  Vaping Use  . Vaping Use: Never used  Substance Use Topics  . Alcohol use: Yes    Alcohol/week: 2.0 standard drinks    Types: 2 Standard drinks or equivalent per week    Comment: one glass of wine per month  .  Drug use: No     Medication list has been reviewed and updated.  Current Meds  Medication Sig  . albuterol (VENTOLIN HFA) 108 (90 Base) MCG/ACT inhaler Inhale 2 puffs into the lungs every 6 (six) hours as needed for wheezing or shortness of breath.  Marland Kitchen amLODipine (NORVASC) 2.5 MG tablet Take 1 tablet (2.5 mg total) by mouth daily.  Marland Kitchen aspirin EC 81 MG tablet Take 81 mg by mouth daily.  . Azelastine-Fluticasone 137-50 MCG/ACT SUSP Place into  the nose daily.   . cholecalciferol (VITAMIN D) 400 units TABS tablet Take 400 Units by mouth.  . conjugated estrogens (PREMARIN) vaginal cream Place 1 Applicatorful vaginally daily.  . fexofenadine (ALLEGRA) 180 MG tablet Take 180 mg by mouth daily.   . fluocinonide cream (LIDEX) 0.05 %   . hydrochlorothiazide (HYDRODIURIL) 12.5 MG tablet Take 1 tablet (12.5 mg total) by mouth daily.  . Ibuprofen 200 MG CAPS Take 1 capsule by mouth as needed.   . lansoprazole (PREVACID) 30 MG capsule TAKE 1 CAPSULE BY MOUTH TWICE DAILY BEFORE A MEAL (Patient taking differently: Take 30 mg by mouth daily at 12 noon.)  . lidocaine (LIDODERM) 5 % Place 1 patch onto the skin daily.   . meclizine (ANTIVERT) 25 MG tablet Take 25 mg by mouth 3 (three) times daily as needed for dizziness.  . meloxicam (MOBIC) 7.5 MG tablet Take 7.5 mg by mouth at bedtime as needed.   Marland Kitchen olopatadine (PATANOL) 0.1 % ophthalmic solution Apply to eye.    PHQ 2/9 Scores 05/07/2020 03/12/2020 02/17/2020 02/16/2019  PHQ - 2 Score 0 0 0 0  PHQ- 9 Score 0 - 0 -    GAD 7 : Generalized Anxiety Score 05/07/2020 02/17/2020  Nervous, Anxious, on Edge 0 0  Control/stop worrying 0 0  Worry too much - different things 0 0  Trouble relaxing 0 0  Restless 0 0  Easily annoyed or irritable 0 0  Afraid - awful might happen 0 0  Total GAD 7 Score 0 0  Anxiety Difficulty - Not difficult at all    BP Readings from Last 3 Encounters:  05/07/20 138/68  02/17/20 (!) 162/84  02/16/19 136/78    Physical Exam Vitals and nursing note reviewed.  Constitutional:      General: She is not in acute distress.    Appearance: She is well-developed.  HENT:     Head: Normocephalic and atraumatic.  Cardiovascular:     Rate and Rhythm: Normal rate and regular rhythm.     Pulses: Normal pulses.     Heart sounds: No murmur heard. No gallop.   Pulmonary:     Effort: Pulmonary effort is normal. No respiratory distress.     Breath sounds: No wheezing or  rhonchi.  Musculoskeletal:     Cervical back: Normal range of motion.     Right lower leg: No edema.     Left lower leg: No edema.  Skin:    General: Skin is warm and dry.     Findings: No rash.  Neurological:     Mental Status: She is alert and oriented to person, place, and time.     Gait: Gait abnormal (uses rolator).  Psychiatric:        Mood and Affect: Mood and affect normal.        Behavior: Behavior normal.        Thought Content: Thought content normal.     Wt Readings from Last 3 Encounters:  05/07/20  180 lb (81.6 kg)  02/17/20 176 lb (79.8 kg)  02/16/19 179 lb (81.2 kg)    BP 138/68   Pulse 94   Temp 98.3 F (36.8 C) (Oral)   Ht 5\' 2"  (1.575 m)   Wt 180 lb (81.6 kg)   SpO2 95%   BMI 32.92 kg/m   Assessment and Plan: 1. Essential (primary) hypertension BP controlled on amlodipine and low dose hctz but with side effects Stop amlodipine and increase hctz to 25 mg per day Monitor BP at home - call in one month with readings Return if problems; otherwise in 5 months. - hydrochlorothiazide (HYDRODIURIL) 25 MG tablet; Take 1 tablet (25 mg total) by mouth daily.  Dispense: 90 tablet; Refill: 1   Partially dictated using Editor, commissioning. Any errors are unintentional.  Halina Maidens, MD [facility]  05/07/2020

## 2020-05-16 DIAGNOSIS — H353231 Exudative age-related macular degeneration, bilateral, with active choroidal neovascularization: Secondary | ICD-10-CM | POA: Diagnosis not present

## 2020-05-23 DIAGNOSIS — L570 Actinic keratosis: Secondary | ICD-10-CM | POA: Diagnosis not present

## 2020-05-23 DIAGNOSIS — L853 Xerosis cutis: Secondary | ICD-10-CM | POA: Diagnosis not present

## 2020-05-23 DIAGNOSIS — Z85828 Personal history of other malignant neoplasm of skin: Secondary | ICD-10-CM | POA: Diagnosis not present

## 2020-05-29 ENCOUNTER — Other Ambulatory Visit: Payer: Self-pay | Admitting: Internal Medicine

## 2020-05-29 DIAGNOSIS — K2100 Gastro-esophageal reflux disease with esophagitis, without bleeding: Secondary | ICD-10-CM

## 2020-06-07 DIAGNOSIS — M25512 Pain in left shoulder: Secondary | ICD-10-CM | POA: Diagnosis not present

## 2020-06-21 DIAGNOSIS — M6281 Muscle weakness (generalized): Secondary | ICD-10-CM | POA: Diagnosis not present

## 2020-06-21 DIAGNOSIS — M25512 Pain in left shoulder: Secondary | ICD-10-CM | POA: Diagnosis not present

## 2020-06-21 DIAGNOSIS — R293 Abnormal posture: Secondary | ICD-10-CM | POA: Diagnosis not present

## 2020-06-27 DIAGNOSIS — H353221 Exudative age-related macular degeneration, left eye, with active choroidal neovascularization: Secondary | ICD-10-CM | POA: Diagnosis not present

## 2020-06-27 DIAGNOSIS — H353231 Exudative age-related macular degeneration, bilateral, with active choroidal neovascularization: Secondary | ICD-10-CM | POA: Diagnosis not present

## 2020-07-04 DIAGNOSIS — R293 Abnormal posture: Secondary | ICD-10-CM | POA: Diagnosis not present

## 2020-07-04 DIAGNOSIS — M6281 Muscle weakness (generalized): Secondary | ICD-10-CM | POA: Diagnosis not present

## 2020-07-04 DIAGNOSIS — M25512 Pain in left shoulder: Secondary | ICD-10-CM | POA: Diagnosis not present

## 2020-07-12 DIAGNOSIS — R293 Abnormal posture: Secondary | ICD-10-CM | POA: Diagnosis not present

## 2020-07-12 DIAGNOSIS — M25512 Pain in left shoulder: Secondary | ICD-10-CM | POA: Diagnosis not present

## 2020-07-12 DIAGNOSIS — M6281 Muscle weakness (generalized): Secondary | ICD-10-CM | POA: Diagnosis not present

## 2020-07-18 DIAGNOSIS — M1812 Unilateral primary osteoarthritis of first carpometacarpal joint, left hand: Secondary | ICD-10-CM | POA: Diagnosis not present

## 2020-07-19 DIAGNOSIS — R293 Abnormal posture: Secondary | ICD-10-CM | POA: Diagnosis not present

## 2020-07-19 DIAGNOSIS — M25512 Pain in left shoulder: Secondary | ICD-10-CM | POA: Diagnosis not present

## 2020-07-19 DIAGNOSIS — M6281 Muscle weakness (generalized): Secondary | ICD-10-CM | POA: Diagnosis not present

## 2020-07-26 DIAGNOSIS — M6281 Muscle weakness (generalized): Secondary | ICD-10-CM | POA: Diagnosis not present

## 2020-07-26 DIAGNOSIS — R293 Abnormal posture: Secondary | ICD-10-CM | POA: Diagnosis not present

## 2020-07-26 DIAGNOSIS — M25512 Pain in left shoulder: Secondary | ICD-10-CM | POA: Diagnosis not present

## 2020-08-10 DIAGNOSIS — M25512 Pain in left shoulder: Secondary | ICD-10-CM | POA: Insufficient documentation

## 2020-08-15 DIAGNOSIS — H353221 Exudative age-related macular degeneration, left eye, with active choroidal neovascularization: Secondary | ICD-10-CM | POA: Diagnosis not present

## 2020-08-15 DIAGNOSIS — H353231 Exudative age-related macular degeneration, bilateral, with active choroidal neovascularization: Secondary | ICD-10-CM | POA: Diagnosis not present

## 2020-10-17 DIAGNOSIS — H353221 Exudative age-related macular degeneration, left eye, with active choroidal neovascularization: Secondary | ICD-10-CM | POA: Diagnosis not present

## 2020-10-17 DIAGNOSIS — H353231 Exudative age-related macular degeneration, bilateral, with active choroidal neovascularization: Secondary | ICD-10-CM | POA: Diagnosis not present

## 2020-10-19 ENCOUNTER — Other Ambulatory Visit: Payer: Self-pay | Admitting: Internal Medicine

## 2020-10-24 ENCOUNTER — Encounter: Payer: Self-pay | Admitting: Internal Medicine

## 2020-10-24 ENCOUNTER — Other Ambulatory Visit: Payer: Self-pay

## 2020-10-24 ENCOUNTER — Ambulatory Visit: Payer: Medicare PPO | Admitting: Internal Medicine

## 2020-10-24 VITALS — BP 128/82 | HR 95 | Temp 98.4°F | Ht 62.0 in | Wt 184.0 lb

## 2020-10-24 DIAGNOSIS — H1013 Acute atopic conjunctivitis, bilateral: Secondary | ICD-10-CM | POA: Diagnosis not present

## 2020-10-24 DIAGNOSIS — K2100 Gastro-esophageal reflux disease with esophagitis, without bleeding: Secondary | ICD-10-CM

## 2020-10-24 DIAGNOSIS — I10 Essential (primary) hypertension: Secondary | ICD-10-CM

## 2020-10-24 MED ORDER — LANSOPRAZOLE 30 MG PO CPDR: DELAYED_RELEASE_CAPSULE | ORAL | 1 refills | Status: DC

## 2020-10-24 MED ORDER — OLOPATADINE HCL 0.1 % OP SOLN: 1.0000 [drp] | Freq: Two times a day (BID) | OPHTHALMIC | 3 refills | Status: DC

## 2020-10-24 MED ORDER — HYDROCHLOROTHIAZIDE 12.5 MG PO TABS: 25.0000 mg | ORAL_TABLET | Freq: Every day | ORAL | 3 refills | Status: DC

## 2020-10-24 NOTE — Progress Notes (Signed)
Date:  10/24/2020   Name:  Barbara West   DOB:  May 21, 1945   MRN:  [id number]   Chief Complaint: Follow-up and Hypertension (Advised to stop amlodipine and increase hydrochlorothiazide to 25 mg on 05/07/2020; tolerating well)  Hypertension This is a chronic problem. The problem is controlled. Pertinent negatives include no chest pain, headaches, palpitations or shortness of breath. Past treatments include diuretics. The current treatment provides significant improvement. There are no compliance problems.   Gastroesophageal Reflux She complains of heartburn. She reports no abdominal pain, no chest pain, no coughing or no wheezing. This is a recurrent problem. Pertinent negatives include no fatigue. She has tried a PPI and a histamine-2 antagonist for the symptoms. The treatment provided significant relief.   Lab Results  Component Value Date   CREATININE 0.79 02/17/2020   BUN 11 02/17/2020   NA 135 02/17/2020   K 4.1 02/17/2020   CL 98 02/17/2020   CO2 22 02/17/2020   Lab Results  Component Value Date   CHOL 299 (H) 02/17/2020   HDL 74 02/17/2020   LDLCALC 186 (H) 02/17/2020   TRIG 208 (H) 02/17/2020   CHOLHDL 4.0 02/17/2020   Lab Results  Component Value Date   TSH 1.840 02/17/2020   No results found for: HGBA1C Lab Results  Component Value Date   WBC 11.0 (H) 02/17/2020   HGB 14.4 02/17/2020   HCT 42.9 02/17/2020   MCV 86 02/17/2020   PLT 399 02/17/2020   Lab Results  Component Value Date   ALT 7 02/17/2020   AST 14 02/17/2020   ALKPHOS 103 02/17/2020   BILITOT 0.3 02/17/2020     Review of Systems  Constitutional:  Negative for fatigue and unexpected weight change.  HENT:  Negative for nosebleeds.   Eyes:  Positive for redness and itching. Negative for visual disturbance.  Respiratory:  Negative for cough, chest tightness, shortness of breath and wheezing.   Cardiovascular:  Negative for chest pain, palpitations and leg swelling.  Gastrointestinal:   Positive for heartburn. Negative for abdominal pain, constipation and diarrhea.  Musculoskeletal:  Positive for arthralgias and gait problem.  Neurological:  Negative for dizziness, weakness, light-headedness and headaches.  Psychiatric/Behavioral:  Negative for dysphoric mood and sleep disturbance. The patient is not nervous/anxious.    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/2015   Essential (primary) hypertension 01/22/2015   Gastroesophageal reflux disease with esophagitis 01/22/2015    Allergies  Allergen Reactions   Celecoxib Anaphylaxis   Atorvastatin    Clarithromycin Other (See Comments)   Codeine Other (See Comments), Nausea Only and Nausea And Vomiting   Losartan Potassium Other (See Comments)    Pt does not believe this is correct but will not remove since it was more than 5 years ago   Morphine And Related Other (See Comments), Nausea Only and Nausea And Vomiting   Shellfish Allergy Nausea Only   Sulfa Antibiotics Nausea And Vomiting   Lisinopril Rash    Red skin   Mupirocin Rash    Cream/ointment.   Penicillins Rash    Past Surgical History:  Procedure Laterality Date  CATARACT EXTRACTION Bilateral    ESOPHAGOGASTRODUODENOSCOPY  2010   [facility]   HIP SURGERY Right 06/2019   REPLACEMENT TOTAL KNEE BILATERAL Bilateral     Social History   Tobacco Use   Smoking status: Never   Smokeless tobacco: Never  Vaping Use   Vaping Use: Never used  Substance Use Topics   Alcohol use: Not Currently    Alcohol/week: 1.0 standard drink    Types: 1 Glasses of wine per week   Drug use:  Never     Medication list has been reviewed and updated.  Current Meds  Medication Sig   albuterol (VENTOLIN HFA) 108 (90 Base) MCG/ACT inhaler Inhale 2 puffs into the lungs every 6 (six) hours as needed for wheezing or shortness of breath.   aspirin EC 81 MG tablet Take 81 mg by mouth daily.   Azelastine-Fluticasone 137-50 MCG/ACT SUSP Place 1 spray into both nostrils daily.   cholecalciferol (VITAMIN D) 400 units TABS tablet Take 400 Units by mouth daily.   conjugated estrogens (PREMARIN) vaginal cream Place 1 Applicatorful vaginally daily.   fexofenadine (ALLEGRA) 180 MG tablet Take 180 mg by mouth daily.    fluocinonide cream (LIDEX) 1.74 % Apply 1 application topically as needed.   hydrochlorothiazide (HYDRODIURIL) 25 MG tablet Take 1 tablet (25 mg total) by mouth daily.   Ibuprofen 200 MG CAPS Take 1 capsule by mouth as needed.    lansoprazole (PREVACID) 30 MG capsule TAKE 1 CAPSULE BY MOUTH TWICE DAILY BEFORE A MEAL   lidocaine (LIDODERM) 5 % Place 1 patch onto the skin daily as needed.   meclizine (ANTIVERT) 25 MG tablet Take 25 mg by mouth 3 (three) times daily as needed for dizziness.   meloxicam (MOBIC) 7.5 MG tablet Take 7.5 mg by mouth at bedtime as needed.    olopatadine (PATANOL) 0.1 % ophthalmic solution Place 1 drop into both eyes 2 (two) times daily.    PHQ 2/9 Scores 10/24/2020 05/07/2020 03/12/2020 02/17/2020  PHQ - 2 Score 0 0 0 0  PHQ- 9 Score 0 0 - 0    GAD 7 : Generalized Anxiety Score 10/24/2020 05/07/2020 02/17/2020  Nervous, Anxious, on Edge 0 0 0  Control/stop worrying 0 0 0  Worry too much - different things 0 0 0  Trouble relaxing 0 0 0  Restless 0 0 0  Easily annoyed or irritable 0 0 0  Afraid - awful might happen 0 0 0  Total GAD 7 Score 0 0 0  Anxiety Difficulty Not difficult at all - Not difficult at all    BP Readings from Last 3 Encounters:  10/24/20 128/82  05/07/20 138/68  02/17/20 (!) 162/84    Physical Exam Vitals and nursing note  reviewed.  Constitutional:      General: She is not in acute distress.    Appearance: Normal appearance. She is well-developed.  HENT:     Head: Normocephalic and atraumatic.  Neck:     Vascular: No carotid bruit.  Cardiovascular:     Rate and Rhythm: Normal rate and regular rhythm.     Pulses: Normal pulses.     Heart sounds: No murmur heard. Pulmonary:     Effort: Pulmonary effort is normal. No respiratory distress.     Breath sounds: No wheezing or rhonchi.  Musculoskeletal:     Cervical back: Normal range of motion.     Right lower leg: No edema.     Left lower leg: No edema.  Lymphadenopathy:  Cervical: No cervical adenopathy.  Skin:    General: Skin is warm and dry.     Capillary Refill: Capillary refill takes less than 2 seconds.     Findings: No rash.  Neurological:     General: No focal deficit present.     Mental Status: She is alert and oriented to person, place, and time.  Psychiatric:        Mood and Affect: Mood normal.        Behavior: Behavior normal.    Wt Readings from Last 3 Encounters:  10/24/20 184 lb (83.5 kg)  05/07/20 180 lb (81.6 kg)  02/17/20 176 lb (79.8 kg)    BP 128/82 (BP Location: Right Arm, Patient Position: Sitting, Cuff Size: Normal)   Pulse 95   Temp 98.4 F (36.9 C) (Oral)   Ht 5\' 2"  (1.575 m)   Wt 184 lb (83.5 kg)   SpO2 95%   BMI 33.65 kg/m   Assessment and Plan: 1. Essential (primary) hypertension Clinically stable exam with well controlled BP. Tolerating medications without side effects at this time. Taking 25 mg alternating with 12.5 mg. Pt to continue current regimen and low sodium diet; benefits of regular exercise as able discussed. - hydrochlorothiazide (HYDRODIURIL) 12.5 MG tablet; Take 2 tablets (25 mg total) by mouth daily.  Dispense: 180 tablet; Refill: 3  2. Gastroesophageal reflux disease with esophagitis Symptoms well controlled on daily PPI.  Sometimes takes zantac extra if needed No red flag signs such  as weight loss, n/v, melena Will continue current regimen. - lansoprazole (PREVACID) 30 MG capsule; TAKE 1 CAPSULE BY MOUTH DAILY BEFORE A MEAL  Dispense: 90 capsule; Refill: 1  3. Allergic conjunctivitis of both eyes - olopatadine (PATANOL) 0.1 % ophthalmic solution; Place 1 drop into both eyes 2 (two) times daily.  Dispense: 15 mL; Refill: 3   Partially dictated using Editor, commissioning. Any errors are unintentional.  Halina Maidens, MD [facility]  10/24/2020

## 2020-11-15 ENCOUNTER — Other Ambulatory Visit: Payer: Self-pay | Admitting: Internal Medicine

## 2020-11-15 ENCOUNTER — Encounter: Payer: Self-pay | Admitting: Internal Medicine

## 2020-11-15 DIAGNOSIS — K2100 Gastro-esophageal reflux disease with esophagitis, without bleeding: Secondary | ICD-10-CM

## 2020-11-21 DIAGNOSIS — L853 Xerosis cutis: Secondary | ICD-10-CM | POA: Diagnosis not present

## 2020-11-21 DIAGNOSIS — C44729 Squamous cell carcinoma of skin of left lower limb, including hip: Secondary | ICD-10-CM | POA: Diagnosis not present

## 2020-11-21 DIAGNOSIS — L570 Actinic keratosis: Secondary | ICD-10-CM | POA: Diagnosis not present

## 2020-11-21 DIAGNOSIS — B078 Other viral warts: Secondary | ICD-10-CM | POA: Diagnosis not present

## 2020-11-21 DIAGNOSIS — L821 Other seborrheic keratosis: Secondary | ICD-10-CM | POA: Diagnosis not present

## 2020-11-21 DIAGNOSIS — D485 Neoplasm of uncertain behavior of skin: Secondary | ICD-10-CM | POA: Diagnosis not present

## 2020-12-26 DIAGNOSIS — C44729 Squamous cell carcinoma of skin of left lower limb, including hip: Secondary | ICD-10-CM | POA: Diagnosis not present

## 2021-01-02 DIAGNOSIS — H353221 Exudative age-related macular degeneration, left eye, with active choroidal neovascularization: Secondary | ICD-10-CM | POA: Diagnosis not present

## 2021-01-02 DIAGNOSIS — H353231 Exudative age-related macular degeneration, bilateral, with active choroidal neovascularization: Secondary | ICD-10-CM | POA: Diagnosis not present

## 2021-01-09 DIAGNOSIS — J343 Hypertrophy of nasal turbinates: Secondary | ICD-10-CM | POA: Diagnosis not present

## 2021-01-09 DIAGNOSIS — J310 Chronic rhinitis: Secondary | ICD-10-CM | POA: Diagnosis not present

## 2021-01-16 DIAGNOSIS — Z96653 Presence of artificial knee joint, bilateral: Secondary | ICD-10-CM | POA: Diagnosis not present

## 2021-01-16 DIAGNOSIS — T84062A Wear of articular bearing surface of internal prosthetic right knee joint, initial encounter: Secondary | ICD-10-CM | POA: Diagnosis not present

## 2021-01-16 DIAGNOSIS — M533 Sacrococcygeal disorders, not elsewhere classified: Secondary | ICD-10-CM | POA: Diagnosis not present

## 2021-01-16 DIAGNOSIS — M25552 Pain in left hip: Secondary | ICD-10-CM | POA: Diagnosis not present

## 2021-02-03 ENCOUNTER — Other Ambulatory Visit: Payer: Self-pay | Admitting: Internal Medicine

## 2021-02-03 ENCOUNTER — Encounter: Payer: Self-pay | Admitting: Internal Medicine

## 2021-02-03 DIAGNOSIS — I10 Essential (primary) hypertension: Secondary | ICD-10-CM

## 2021-02-03 NOTE — Telephone Encounter (Signed)
Called [facility] and spoke with Lovena Le and this dose was dc'd 10/24/20

## 2021-02-03 NOTE — Telephone Encounter (Deleted)
Due for appt- Refilled with 30 day courtesy RF

## 2021-02-06 DIAGNOSIS — M533 Sacrococcygeal disorders, not elsewhere classified: Secondary | ICD-10-CM | POA: Diagnosis not present

## 2021-02-20 ENCOUNTER — Encounter: Payer: Self-pay | Admitting: Internal Medicine

## 2021-02-20 ENCOUNTER — Other Ambulatory Visit: Payer: Self-pay

## 2021-02-20 ENCOUNTER — Ambulatory Visit (INDEPENDENT_AMBULATORY_CARE_PROVIDER_SITE_OTHER): Payer: Medicare PPO | Admitting: Internal Medicine

## 2021-02-20 VITALS — BP 122/80 | HR 76 | Ht 62.0 in | Wt 182.2 lb

## 2021-02-20 DIAGNOSIS — Z23 Encounter for immunization: Secondary | ICD-10-CM

## 2021-02-20 DIAGNOSIS — K2100 Gastro-esophageal reflux disease with esophagitis, without bleeding: Secondary | ICD-10-CM

## 2021-02-20 DIAGNOSIS — Z0000 Encounter for general adult medical examination without abnormal findings: Secondary | ICD-10-CM | POA: Diagnosis not present

## 2021-02-20 DIAGNOSIS — E782 Mixed hyperlipidemia: Secondary | ICD-10-CM | POA: Diagnosis not present

## 2021-02-20 DIAGNOSIS — I10 Essential (primary) hypertension: Secondary | ICD-10-CM | POA: Diagnosis not present

## 2021-02-20 DIAGNOSIS — Z1231 Encounter for screening mammogram for malignant neoplasm of breast: Secondary | ICD-10-CM | POA: Diagnosis not present

## 2021-02-20 LAB — POCT URINALYSIS DIPSTICK
Bilirubin, UA: NEGATIVE
Blood, UA: NEGATIVE
Glucose, UA: NEGATIVE
Ketones, UA: NEGATIVE
Leukocytes, UA: NEGATIVE
Nitrite, UA: NEGATIVE
Protein, UA: NEGATIVE
Spec Grav, UA: 1.01 (ref 1.010–1.025)
Urobilinogen, UA: 0.2 E.U./dL
pH, UA: 7 (ref 5.0–8.0)

## 2021-02-20 MED ORDER — HYDROCHLOROTHIAZIDE 25 MG PO TABS: 25.0000 mg | ORAL_TABLET | Freq: Every day | ORAL | 3 refills | Status: DC
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
## 2021-02-21 LAB — COMPREHENSIVE METABOLIC PANEL
ALT: 10 IU/L (ref 0–32)
AST: 17 IU/L (ref 0–40)
Albumin/Globulin Ratio: 1.6 (ref 1.2–2.2)
Albumin: 4.2 g/dL (ref 3.7–4.7)
Alkaline Phosphatase: 140 IU/L — ABNORMAL HIGH (ref 44–121)
BUN/Creatinine Ratio: 19 (ref 12–28)
BUN: 17 mg/dL (ref 8–27)
Bilirubin Total: 0.3 mg/dL (ref 0.0–1.2)
CO2: 22 mmol/L (ref 20–29)
Calcium: 9.6 mg/dL (ref 8.7–10.3)
Chloride: 98 mmol/L (ref 96–106)
Creatinine, Ser: 0.88 mg/dL (ref 0.57–1.00)
Globulin, Total: 2.7 g/dL (ref 1.5–4.5)
Glucose: 90 mg/dL (ref 70–99)
Potassium: 4 mmol/L (ref 3.5–5.2)
Sodium: 136 mmol/L (ref 134–144)
Total Protein: 6.9 g/dL (ref 6.0–8.5)
eGFR: 68 mL/min/{1.73_m2} (ref >59)

## 2021-02-21 LAB — CBC WITH DIFFERENTIAL/PLATELET
Basophils Absolute: 0.1 10*3/uL (ref 0.0–0.2)
Basos: 1 %
EOS (ABSOLUTE): 0.2 10*3/uL (ref 0.0–0.4)
Eos: 2 %
Hematocrit: 41.7 % (ref 34.0–46.6)
Hemoglobin: 14.6 g/dL (ref 11.1–15.9)
Immature Grans (Abs): 0.1 10*3/uL (ref 0.0–0.1)
Immature Granulocytes: 1 %
Lymphocytes Absolute: 3.5 10*3/uL — ABNORMAL HIGH (ref 0.7–3.1)
Lymphs: 27 %
MCH: 29.9 pg (ref 26.6–33.0)
MCHC: 35 g/dL (ref 31.5–35.7)
MCV: 85 fL (ref 79–97)
Monocytes Absolute: 1.6 10*3/uL — ABNORMAL HIGH (ref 0.1–0.9)
Monocytes: 12 %
Neutrophils Absolute: 7.7 10*3/uL — ABNORMAL HIGH (ref 1.4–7.0)
Neutrophils: 57 %
Platelets: 415 10*3/uL (ref 150–450)
RBC: 4.89 x10E6/uL (ref 3.77–5.28)
RDW: 13.1 % (ref 11.7–15.4)
WBC: 13.1 10*3/uL — ABNORMAL HIGH (ref 3.4–10.8)

## 2021-02-21 LAB — LIPID PANEL
Chol/HDL Ratio: 3.6 ratio (ref 0.0–4.4)
Cholesterol, Total: 282 mg/dL — ABNORMAL HIGH (ref 100–199)
HDL: 78 mg/dL (ref >39)
LDL Chol Calc (NIH): 179 mg/dL — ABNORMAL HIGH (ref 0–99)
Triglycerides: 142 mg/dL (ref 0–149)
VLDL Cholesterol Cal: 25 mg/dL (ref 5–40)

## 2021-02-21 LAB — TSH: TSH: 2.24 u[IU]/mL (ref 0.450–4.500)

## 2021-02-22 ENCOUNTER — Encounter: Payer: Self-pay | Admitting: Internal Medicine

## 2021-03-13 ENCOUNTER — Ambulatory Visit: Payer: Medicare PPO

## 2021-03-15 DIAGNOSIS — M5136 Other intervertebral disc degeneration, lumbar region: Secondary | ICD-10-CM | POA: Diagnosis not present

## 2021-03-15 DIAGNOSIS — Z96659 Presence of unspecified artificial knee joint: Secondary | ICD-10-CM | POA: Insufficient documentation

## 2021-03-15 DIAGNOSIS — Z96653 Presence of artificial knee joint, bilateral: Secondary | ICD-10-CM | POA: Diagnosis not present

## 2021-03-15 DIAGNOSIS — M51369 Other intervertebral disc degeneration, lumbar region without mention of lumbar back pain or lower extremity pain: Secondary | ICD-10-CM | POA: Insufficient documentation

## 2021-04-12 ENCOUNTER — Telehealth: Payer: Self-pay | Admitting: Internal Medicine

## 2021-04-12 NOTE — Telephone Encounter (Signed)
Copied from [place] [phone]. Topic: Medicare AWV >> Apr 12, 2021 11:24 AM Cher Nakai R wrote: Reason for CRM:  Left message for patient to call back and schedule Medicare Annual Wellness Visit (AWV) in office.   If unable to come into the office for AWV,  please offer to do virtually or by telephone.  Last AWV: 03/12/2020  Please schedule at anytime with [facility] Health Advisor.      40 Minutes appointment   Any questions, please call me at [phone]

## 2021-05-05 DIAGNOSIS — F40240 Claustrophobia: Secondary | ICD-10-CM

## 2021-05-05 HISTORY — DX: Claustrophobia: F40.240

## 2021-05-08 DIAGNOSIS — M5136 Other intervertebral disc degeneration, lumbar region: Secondary | ICD-10-CM | POA: Diagnosis not present

## 2021-05-09 ENCOUNTER — Other Ambulatory Visit: Payer: Self-pay | Admitting: Internal Medicine

## 2021-05-09 DIAGNOSIS — M5416 Radiculopathy, lumbar region: Secondary | ICD-10-CM | POA: Diagnosis not present

## 2021-05-09 DIAGNOSIS — K2100 Gastro-esophageal reflux disease with esophagitis, without bleeding: Secondary | ICD-10-CM

## 2021-05-09 DIAGNOSIS — M5136 Other intervertebral disc degeneration, lumbar region: Secondary | ICD-10-CM | POA: Diagnosis not present

## 2021-05-09 NOTE — Telephone Encounter (Signed)
Requested Prescriptions  Pending Prescriptions Disp Refills   lansoprazole (PREVACID) 30 MG capsule [Pharmacy Med Name: LANSOPRAZOLE 30MG  DR CAPSULES] 90 capsule 1    Sig: TAKE 1 CAPSULE BY MOUTH DAILY BEFORE A MEAL     Gastroenterology: Proton Pump Inhibitors Passed - 05/09/2021  3:43 AM      Passed - Valid encounter within last 12 months    Recent Outpatient Visits          2 months ago Annual physical exam   [facility] Glean Hess, MD   6 months ago Essential (primary) hypertension   [facility] Glean Hess, MD   1 year ago Essential (primary) hypertension   [facility] Glean Hess, MD   1 year ago Annual physical exam   [facility] Glean Hess, MD   2 years ago Encounter for screening for malignant neoplasm of colon   [facility] Glean Hess, MD      Future Appointments            In 9 months Army Melia Jesse Sans, MD [facility], [facility]

## 2021-05-14 ENCOUNTER — Other Ambulatory Visit: Payer: Self-pay | Admitting: Internal Medicine

## 2021-05-14 ENCOUNTER — Encounter: Payer: Self-pay | Admitting: Internal Medicine

## 2021-05-14 NOTE — Telephone Encounter (Signed)
Requested Prescriptions  Pending Prescriptions Disp Refills   hydrochlorothiazide (HYDRODIURIL) 12.5 MG tablet [Pharmacy Med Name: HYDROCHLOROTHIAZIDE 12.5MG  TABLETS] 180 tablet     Sig: TAKE 2 TABLETS BY MOUTH DAILY     Cardiovascular: Diuretics - Thiazide Passed - 05/14/2021 11:08 AM      Passed - Ca in normal range and within 360 days    Calcium  Date Value Ref Range Status  02/20/2021 9.6 8.7 - 10.3 mg/dL Final         Passed - Cr in normal range and within 360 days    Creatinine, Ser  Date Value Ref Range Status  02/20/2021 0.88 0.57 - 1.00 mg/dL Final         Passed - K in normal range and within 360 days    Potassium  Date Value Ref Range Status  02/20/2021 4.0 3.5 - 5.2 mmol/L Final         Passed - Na in normal range and within 360 days    Sodium  Date Value Ref Range Status  02/20/2021 136 134 - 144 mmol/L Final         Passed - Last BP in normal range    BP Readings from Last 1 Encounters:  02/20/21 122/80         Passed - Valid encounter within last 6 months    Recent Outpatient Visits          2 months ago Annual physical exam   [facility] Glean Hess, MD   6 months ago Essential (primary) hypertension   [facility] Glean Hess, MD   1 year ago Essential (primary) hypertension   [facility] Glean Hess, MD   1 year ago Annual physical exam   [facility] Glean Hess, MD   2 years ago Encounter for screening for malignant neoplasm of colon   [facility] Glean Hess, MD      Future Appointments            In 9 months Army Melia Jesse Sans, MD [facility], [facility]

## 2021-05-20 ENCOUNTER — Ambulatory Visit (INDEPENDENT_AMBULATORY_CARE_PROVIDER_SITE_OTHER): Payer: Medicare PPO

## 2021-05-20 DIAGNOSIS — Z0000 Encounter for general adult medical examination without abnormal findings: Secondary | ICD-10-CM | POA: Diagnosis not present

## 2021-05-20 NOTE — Patient Instructions (Signed)
Barbara West , Thank you for taking time to come for your Medicare Wellness Visit. I appreciate your ongoing commitment to your health goals. Please review the following plan we discussed and let me know if I can assist you in the future.   Screening recommendations/referrals: Colonoscopy: done 03/24/19. Repeat 03/2024 Mammogram: done 01/06/20: due Bone Density: done 2012 Recommended yearly ophthalmology/optometry visit for glaucoma screening and checkup Recommended yearly dental visit for hygiene and checkup  Vaccinations: Influenza vaccine: done 02/20/21 Pneumococcal vaccine: done 02/20/14 Tdap vaccine: due Shingles vaccine: Shingrix discussed. Please contact your pharmacy for coverage information.  Covid-19: done 06/17/19, 07/08/19  Advanced directives: Please bring a copy of your health care power of attorney and living will to the office at your convenience.   Conditions/risks identified: Recommend continuing fall prevention in the home  Next appointment: Follow up in one year for your annual wellness visit    Preventive Care 65 Years and Older, Female Preventive care refers to lifestyle choices and visits with your health care provider that can promote health and wellness. What does preventive care include? A yearly physical exam. This is also called an annual well check. Dental exams once or twice a year. Routine eye exams. Ask your health care provider how often you should have your eyes checked. Personal lifestyle choices, including: Daily care of your teeth and gums. Regular physical activity. Eating a healthy diet. Avoiding tobacco and drug use. Limiting alcohol use. Practicing safe sex. Taking low-dose aspirin every day. Taking vitamin and mineral supplements as recommended by your health care provider. What happens during an annual well check? The services and screenings done by your health care provider during your annual well check will depend on your age, overall  health, lifestyle risk factors, and family history of disease. Counseling  Your health care provider may ask you questions about your: Alcohol use. Tobacco use. Drug use. Emotional well-being. Home and relationship well-being. Sexual activity. Eating habits. History of falls. Memory and ability to understand (cognition). Work and work Statistician. Reproductive health. Screening  You may have the following tests or measurements: Height, weight, and BMI. Blood pressure. Lipid and cholesterol levels. These may be checked every 5 years, or more frequently if you are over 59 years old. Skin check. Lung cancer screening. You may have this screening every year starting at age 88 if you have a 30-pack-year history of smoking and currently smoke or have quit within the past 15 years. Fecal occult blood test (FOBT) of the stool. You may have this test every year starting at age 46. Flexible sigmoidoscopy or colonoscopy. You may have a sigmoidoscopy every 5 years or a colonoscopy every 10 years starting at age 27. Hepatitis C blood test. Hepatitis B blood test. Sexually transmitted disease (STD) testing. Diabetes screening. This is done by checking your blood sugar (glucose) after you have not eaten for a while (fasting). You may have this done every 1-3 years. Bone density scan. This is done to screen for osteoporosis. You may have this done starting at age 79. Mammogram. This may be done every 1-2 years. Talk to your health care provider about how often you should have regular mammograms. Talk with your health care provider about your test results, treatment options, and if necessary, the need for more tests. Vaccines  Your health care provider may recommend certain vaccines, such as: Influenza vaccine. This is recommended every year. Tetanus, diphtheria, and acellular pertussis (Tdap, Td) vaccine. You may need a Td booster every 10 years.  Zoster vaccine. You may need this after age  13. Pneumococcal 13-valent conjugate (PCV13) vaccine. One dose is recommended after age 6. Pneumococcal polysaccharide (PPSV23) vaccine. One dose is recommended after age 35. Talk to your health care provider about which screenings and vaccines you need and how often you need them. This information is not intended to replace advice given to you by your health care provider. Make sure you discuss any questions you have with your health care provider. Document Released: 05/18/2015 Document Revised: 01/09/2016 Document Reviewed: 02/20/2015 Elsevier Interactive Patient Education  2017 [place] Prevention in the Home Falls can cause injuries. They can happen to people of all ages. There are many things you can do to make your home safe and to help prevent falls. What can I do on the outside of my home? Regularly fix the edges of walkways and driveways and fix any cracks. Remove anything that might make you trip as you walk through a door, such as a raised step or threshold. Trim any bushes or trees on the path to your home. Use bright outdoor lighting. Clear any walking paths of anything that might make someone trip, such as rocks or tools. Regularly check to see if handrails are loose or broken. Make sure that both sides of any steps have handrails. Any raised decks and porches should have guardrails on the edges. Have any leaves, snow, or ice cleared regularly. Use sand or salt on walking paths during winter. Clean up any spills in your garage right away. This includes oil or grease spills. What can I do in the bathroom? Use night lights. Install grab bars by the toilet and in the tub and shower. Do not use towel bars as grab bars. Use non-skid mats or decals in the tub or shower. If you need to sit down in the shower, use a plastic, non-slip stool. Keep the floor dry. Clean up any water that spills on the floor as soon as it happens. Remove soap buildup in the tub or shower  regularly. Attach bath mats securely with double-sided non-slip rug tape. Do not have throw rugs and other things on the floor that can make you trip. What can I do in the bedroom? Use night lights. Make sure that you have a light by your bed that is easy to reach. Do not use any sheets or blankets that are too big for your bed. They should not hang down onto the floor. Have a firm chair that has side arms. You can use this for support while you get dressed. Do not have throw rugs and other things on the floor that can make you trip. What can I do in the kitchen? Clean up any spills right away. Avoid walking on wet floors. Keep items that you use a lot in easy-to-reach places. If you need to reach something above you, use a strong step stool that has a grab bar. Keep electrical cords out of the way. Do not use floor polish or wax that makes floors slippery. If you must use wax, use non-skid floor wax. Do not have throw rugs and other things on the floor that can make you trip. What can I do with my stairs? Do not leave any items on the stairs. Make sure that there are handrails on both sides of the stairs and use them. Fix handrails that are broken or loose. Make sure that handrails are as long as the stairways. Check any carpeting to make sure that  it is firmly attached to the stairs. Fix any carpet that is loose or worn. Avoid having throw rugs at the top or bottom of the stairs. If you do have throw rugs, attach them to the floor with carpet tape. Make sure that you have a light switch at the top of the stairs and the bottom of the stairs. If you do not have them, ask someone to add them for you. What else can I do to help prevent falls? Wear shoes that: Do not have high heels. Have rubber bottoms. Are comfortable and fit you well. Are closed at the toe. Do not wear sandals. If you use a stepladder: Make sure that it is fully opened. Do not climb a closed stepladder. Make sure that  both sides of the stepladder are locked into place. Ask someone to hold it for you, if possible. Clearly mark and make sure that you can see: Any grab bars or handrails. First and last steps. Where the edge of each step is. Use tools that help you move around (mobility aids) if they are needed. These include: Canes. Walkers. Scooters. Crutches. Turn on the lights when you go into a dark area. Replace any light bulbs as soon as they burn out. Set up your furniture so you have a clear path. Avoid moving your furniture around. If any of your floors are uneven, fix them. If there are any pets around you, be aware of where they are. Review your medicines with your doctor. Some medicines can make you feel dizzy. This can increase your chance of falling. Ask your doctor what other things that you can do to help prevent falls. This information is not intended to replace advice given to you by your health care provider. Make sure you discuss any questions you have with your health care provider. Document Released: 02/15/2009 Document Revised: 09/27/2015 Document Reviewed: 05/26/2014 Elsevier Interactive Patient Education  2017 [employer].

## 2021-05-20 NOTE — Progress Notes (Signed)
Subjective:   Barbara West is a 76 y.o. female who presents for Medicare Annual (Subsequent) preventive examination.  Virtual Visit via Telephone Note  I connected with  Barbara West on 05/20/21 at  1:20 PM EST by telephone and verified that I am speaking with the correct person using two identifiers.  Location: Patient: home Provider: [facility] Persons participating in the virtual visit: [place]   I discussed the limitations, risks, security and privacy concerns of performing an evaluation and management service by telephone and the availability of in person appointments. The patient expressed understanding and agreed to proceed.  Interactive audio and video telecommunications were attempted between this nurse and patient, however failed, due to patient having technical difficulties OR patient did not have access to video capability.  We continued and completed visit with audio only.  Some vital signs may be absent or patient reported.   Clemetine Marker, LPN   Review of Systems     Cardiac Risk Factors include: advanced age (>42men, >89 women);dyslipidemia;hypertension;obesity (BMI >30kg/m2);sedentary lifestyle     Objective:    Today's Vitals   05/20/21 1335  PainSc: 10-Worst pain ever   There is no height or weight on file to calculate BMI.  Advanced Directives 05/20/2021 03/12/2020 10/06/2018 09/16/2017 09/05/2016 07/11/2015  Does Patient Have a Medical Advance Directive? Yes Yes Yes Yes Yes Yes  Type of Paramedic of [place];Living will [place];Living will [place];Living will [place];Living will [place];Living will [place];Living will  Copy of [place] in Chart? No - copy requested No - copy requested No - copy requested No - copy requested No - copy requested -    Current Medications (verified) Outpatient Encounter  Medications as of 05/20/2021  Medication Sig   albuterol (VENTOLIN HFA) 108 (90 Base) MCG/ACT inhaler Inhale 2 puffs into the lungs every 6 (six) hours as needed for wheezing or shortness of breath.   aspirin EC 81 MG tablet Take 81 mg by mouth daily.   Azelastine-Fluticasone 137-50 MCG/ACT SUSP Place 1 spray into both nostrils daily.   cholecalciferol (VITAMIN D) 400 units TABS tablet Take 400 Units by mouth daily.   conjugated estrogens (PREMARIN) vaginal cream Place 1 Applicatorful vaginally daily.   fexofenadine (ALLEGRA) 180 MG tablet Take 180 mg by mouth daily.    fluocinonide cream (LIDEX) 5.73 % Apply 1 application topically as needed.   gabapentin (NEURONTIN) 100 MG capsule Take 1 capsule by mouth at bedtime.   hydrochlorothiazide (HYDRODIURIL) 25 MG tablet Take 1 tablet (25 mg total) by mouth daily.   Ibuprofen 200 MG CAPS Take 1 capsule by mouth as needed.    lansoprazole (PREVACID) 30 MG capsule TAKE 1 CAPSULE BY MOUTH DAILY BEFORE A MEAL   lidocaine (LIDODERM) 5 % Place 1 patch onto the skin daily as needed.   meclizine (ANTIVERT) 25 MG tablet Take 25 mg by mouth 3 (three) times daily as needed for dizziness.   multivitamin-lutein (OCUVITE-LUTEIN) CAPS capsule Take 1 capsule by mouth daily.   olopatadine (PATANOL) 0.1 % ophthalmic solution Place 1 drop into both eyes 2 (two) times daily.   No facility-administered encounter medications on file as of 05/20/2021.    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 Virginia Rochester - [facility]; on slow prednisone taper   Seasonal allergies     Trochanteric bursitis    Vertigo    Vitamin D deficiency    Past Surgical History:  Procedure Laterality Date   CATARACT EXTRACTION Bilateral    ESOPHAGOGASTRODUODENOSCOPY  2010   [place]   HIP SURGERY Right 06/2019   JOINT REPLACEMENT  2009   bilateral   REPLACEMENT TOTAL KNEE BILATERAL Bilateral    Family History  Problem Relation Age of Onset   Alzheimer's disease Mother    Vision loss Father    Heart disease Brother    Social History   Socioeconomic History   Marital status: Married    Spouse name: Letecia Arps   Number of children: 0   Years of education: 14   Highest education level: Associate degree: occupational, Hotel manager, or vocational program  Occupational History   Occupation: Retired  Tobacco Use   Smoking status: Never   Smokeless tobacco: Never   Tobacco comments:    Never used  Scientific laboratory technician Use: Never used  Substance and Sexual Activity   Alcohol use: Not Currently    Alcohol/week: 1.0 standard drink    Types: 1 Glasses of wine per week    Comment: rarely   Drug use: No   Sexual activity: Yes    Partners: Male    Birth control/protection: None  Other Topics Concern   Not on file  Social History Narrative   Not on file   Social Determinants of Health   Financial Resource Strain: Low Risk    Difficulty of Paying Living Expenses: Not hard at all  Food Insecurity: No Food Insecurity   Worried About Charity fundraiser in the Last Year: Never true   [place] in the Last Year: Never true  Transportation Needs: No Transportation Needs   Lack of Transportation (Medical): No   Lack of Transportation (Non-Medical): No  Physical Activity: Inactive   Days of Exercise per Week: 0 days   Minutes of Exercise per Session: 0 min  Stress: No Stress Concern Present   Feeling of Stress : Only a little  Social Connections: Moderately Isolated   Frequency of Communication with Friends and Family: More than three  times a week   Frequency of Social Gatherings with Friends and Family: Twice a week   Attends Religious Services: Never   Marine scientist or Organizations: No   Attends Music therapist: Never   Marital Status: Married    Tobacco Counseling Counseling given: Not Answered Tobacco comments: Never used   Clinical Intake:  Pre-visit preparation completed: Yes  Pain : 0-10 Pain Score: 10-Worst pain ever Pain Type: Chronic pain Pain Location: Back Pain Orientation: Lower Pain Descriptors / Indicators: Aching, Sore Pain Onset: More than a month ago Pain Frequency: Constant     Nutritional Risks: None Diabetes: No  How often do you need to have someone help you when you read instructions, pamphlets, or other written materials from your doctor or pharmacy?: 1 - Never   Interpreter Needed?: No  Information entered by :: Clemetine Marker LPN   Activities of Daily Living In your present state of health, do you have any difficulty performing the following activities: 05/20/2021 02/20/2021  Hearing?  N N  Vision? N N  Difficulty concentrating or making decisions? N N  Walking or climbing stairs? Y N  Dressing or bathing? N N  Doing errands, shopping? N N  Preparing Food and eating ? N -  Using the Toilet? N -  In the past six months, have you accidently leaked urine? N -  Do you have problems with loss of bowel control? N -  Managing your Medications? N -  Managing your Finances? N -  Housekeeping or managing your Housekeeping? N -  Some recent data might be hidden    Patient Care Team: Glean Hess, MD as PCP - General (Internal Medicine)  Indicate any recent Medical Services you may have received from other than [facility] providers in the past year (date may be approximate).     Assessment:   This is a routine wellness examination for Barbara West.  Hearing/Vision screen Hearing Screening - Comments:: Pt denies hearing difficulty Vision Screening -  Comments:: Annual vision screenings with Dr. Becky Augusta in [facility]  Dietary issues and exercise activities discussed: Current Exercise Habits: The patient does not participate in regular exercise at present, Exercise limited by: orthopedic condition(s)   Goals Addressed   None    Depression Screen PHQ 2/9 Scores 05/20/2021 02/20/2021 10/24/2020 05/07/2020 03/12/2020 02/17/2020 02/16/2019  PHQ - 2 Score 0 0 0 0 0 0 0  PHQ- 9 Score - 0 0 0 - 0 -    Fall Risk Fall Risk  05/20/2021 02/20/2021 10/24/2020 05/07/2020 03/12/2020  Falls in the past year? 0 0 0 0 0  Number falls in past yr: 0 0 0 - 0  Injury with Fall? 0 0 0 - 0  Risk for fall due to : No Fall Risks Impaired balance/gait Impaired mobility - No Fall Risks  Risk for fall due to: Comment - - - - -  Follow up Falls prevention discussed Falls evaluation completed Falls evaluation completed Falls evaluation completed Falls prevention discussed    FALL RISK PREVENTION PERTAINING TO THE HOME:  Any stairs in or around the home? No  If so, are there any without handrails? No  Home free of loose throw rugs in walkways, pet beds, electrical cords, etc? Yes  Adequate lighting in your home to reduce risk of falls? Yes   ASSISTIVE DEVICES UTILIZED TO PREVENT FALLS:  Life alert? No  Use of a cane, walker or w/c? Yes  Grab bars in the bathroom? Yes  Shower chair or bench in shower? No  Elevated toilet seat or a handicapped toilet? No   TIMED UP AND GO:  Was the test performed? No . Telephonic visit  Cognitive Function: Normal cognitive status assessed by direct observation by this Nurse Health Advisor. No abnormalities found.       6CIT Screen 10/06/2018 09/16/2017 09/05/2016  What Year? 0 points 0 points 0 points  What month? 0 points 0 points 0 points  What time? 0 points 0 points 0 points  Count back from 20 0 points 0 points 0 points  Months in reverse 0 points 0 points 0 points  Repeat phrase 0 points 0 points 0 points  Total Score 0  0 0    Immunizations Immunization History  Administered Date(s) Administered   Fluad Quad(high Dose 65+) 02/16/2019, 02/17/2020, 02/20/2021   Influenza, High Dose Seasonal PF 02/13/2017, 01/11/2018   Influenza,inj,quad, With Preservative 02/13/2017   Influenza-Unspecified 03/06/2015, 02/06/2016, 02/20/2017   PFIZER Comirnaty(Gray Top)Covid-19 Tri-Sucrose Vaccine 06/17/2019, 07/08/2019   Pneumococcal Conjugate-13  02/20/2014   Pneumococcal Polysaccharide-23 04/23/2015    TDAP status: Due, Education has been provided regarding the importance of this vaccine. Advised may receive this vaccine at local pharmacy or Health Dept. Aware to provide a copy of the vaccination record if obtained from local pharmacy or Health Dept. Verbalized acceptance and understanding.  Flu Vaccine status: Up to date  Pneumococcal vaccine status: Up to date  Covid-19 vaccine status: Completed vaccines  Qualifies for Shingles Vaccine? Yes   Zostavax completed Yes   per patient need records Shingrix Completed?: No.    Education has been provided regarding the importance of this vaccine. Patient has been advised to call insurance company to determine out of pocket expense if they have not yet received this vaccine. Advised may also receive vaccine at local pharmacy or Health Dept. Verbalized acceptance and understanding.  Screening Tests Health Maintenance  Topic Date Due   TETANUS/TDAP  Never done   COVID-19 Vaccine (3 - Booster for Pfizer series) 09/02/2019   MAMMOGRAM  01/05/2021   Zoster Vaccines- Shingrix (1 of 2) 05/23/2021 (Originally 08/15/1995)   COLONOSCOPY (Pts 45-2yrs Insurance coverage will need to be confirmed)  03/23/2024   Pneumonia Vaccine 82+ Years old  Completed   INFLUENZA VACCINE  Completed   DEXA SCAN  Completed   Hepatitis C Screening  Completed   HPV VACCINES  Aged Out    Health Maintenance  Health Maintenance Due  Topic Date Due   TETANUS/TDAP  Never done   COVID-19 Vaccine (3  - Booster for [place] series) 09/02/2019   MAMMOGRAM  01/05/2021    Colorectal cancer screening: Type of screening: Colonoscopy. Completed 03/24/19. Repeat every 5 years  Mammogram status: Completed 01/06/20. Repeat every year  Bone Density status: Completed 2012. Results reflect: Bone density results: OSTEOPENIA. Repeat every 2 years. Pt declines repeat screening at this time.   Lung Cancer Screening: (Low Dose CT Chest recommended if Age 20-80 years, 30 pack-year currently smoking OR have quit w/in 15years.) does not qualify.   Additional Screening:  Hepatitis C Screening: does qualify; Completed 04/23/15  Vision Screening: Recommended annual ophthalmology exams for early detection of glaucoma and other disorders of the eye. Is the patient up to date with their annual eye exam?  Yes  Who is the provider or what is the name of the office in which the patient attends annual eye exams? Dr. Becky Augusta.   Dental Screening: Recommended annual dental exams for proper oral hygiene  Community Resource Referral / Chronic Care Management: CRR required this visit?  No   CCM required this visit?  No      Plan:     I have personally reviewed and noted the following in the patients chart:   Medical and social history Use of alcohol, tobacco or illicit drugs  Current medications and supplements including opioid prescriptions.  Functional ability and status Nutritional status Physical activity Advanced directives List of other physicians Hospitalizations, surgeries, and ER visits in previous 12 months Vitals Screenings to include cognitive, depression, and falls Referrals and appointments  In addition, I have reviewed and discussed with patient certain preventive protocols, quality metrics, and best practice recommendations. A written personalized care plan for preventive services as well as general preventive health recommendations were provided to patient.   Due to this being a  telephonic visit, the after visit summary with patients personalized plan was offered to patient via my-chart.   Clemetine Marker, LPN   07/16/9700   Nurse Notes: none

## 2021-05-27 DIAGNOSIS — M5416 Radiculopathy, lumbar region: Secondary | ICD-10-CM | POA: Diagnosis not present

## 2021-06-12 DIAGNOSIS — M533 Sacrococcygeal disorders, not elsewhere classified: Secondary | ICD-10-CM | POA: Diagnosis not present

## 2021-06-26 DIAGNOSIS — L538 Other specified erythematous conditions: Secondary | ICD-10-CM | POA: Diagnosis not present

## 2021-06-26 DIAGNOSIS — L821 Other seborrheic keratosis: Secondary | ICD-10-CM | POA: Diagnosis not present

## 2021-06-26 DIAGNOSIS — D225 Melanocytic nevi of trunk: Secondary | ICD-10-CM | POA: Diagnosis not present

## 2021-06-26 DIAGNOSIS — L298 Other pruritus: Secondary | ICD-10-CM | POA: Diagnosis not present

## 2021-06-26 DIAGNOSIS — D485 Neoplasm of uncertain behavior of skin: Secondary | ICD-10-CM | POA: Diagnosis not present

## 2021-06-26 DIAGNOSIS — C44629 Squamous cell carcinoma of skin of left upper limb, including shoulder: Secondary | ICD-10-CM | POA: Diagnosis not present

## 2021-06-26 DIAGNOSIS — Z85828 Personal history of other malignant neoplasm of skin: Secondary | ICD-10-CM | POA: Diagnosis not present

## 2021-06-26 DIAGNOSIS — M5416 Radiculopathy, lumbar region: Secondary | ICD-10-CM | POA: Diagnosis not present

## 2021-06-26 DIAGNOSIS — L820 Inflamed seborrheic keratosis: Secondary | ICD-10-CM | POA: Diagnosis not present

## 2021-06-26 DIAGNOSIS — L814 Other melanin hyperpigmentation: Secondary | ICD-10-CM | POA: Diagnosis not present

## 2021-06-26 DIAGNOSIS — Z08 Encounter for follow-up examination after completed treatment for malignant neoplasm: Secondary | ICD-10-CM | POA: Diagnosis not present

## 2021-08-02 DIAGNOSIS — M25551 Pain in right hip: Secondary | ICD-10-CM | POA: Diagnosis not present

## 2021-08-02 DIAGNOSIS — M25559 Pain in unspecified hip: Secondary | ICD-10-CM | POA: Diagnosis not present

## 2021-08-20 DIAGNOSIS — C44629 Squamous cell carcinoma of skin of left upper limb, including shoulder: Secondary | ICD-10-CM | POA: Diagnosis not present

## 2021-08-22 ENCOUNTER — Other Ambulatory Visit: Payer: Self-pay

## 2021-08-22 ENCOUNTER — Encounter: Payer: Self-pay | Admitting: Internal Medicine

## 2021-08-22 ENCOUNTER — Other Ambulatory Visit: Payer: Self-pay | Admitting: Internal Medicine

## 2021-08-22 DIAGNOSIS — H1013 Acute atopic conjunctivitis, bilateral: Secondary | ICD-10-CM

## 2021-08-23 NOTE — Telephone Encounter (Signed)
Pt requesting change of pharmacy ?Requested Prescriptions  ?Pending Prescriptions Disp Refills  ?? olopatadine (PATANOL) 0.1 % ophthalmic solution [Pharmacy Med Name: OLOPATADINE 0.1% OPTH SOLN 5ML] 15 mL 1  ?  Sig: INSTILL 1 DROP INTO BOTH EYES TWICE DAILY  ?  ? Ophthalmology:  Antiallergy Passed - 08/22/2021  2:09 PM  ?  ?  Passed - Valid encounter within last 12 months  ?  Recent Outpatient Visits   ?      ? 6 months ago Annual physical exam  ? [facility] Glean Hess, MD  ? 10 months ago Essential (primary) hypertension  ? [facility] Glean Hess, MD  ? 1 year ago Essential (primary) hypertension  ? [facility] Glean Hess, MD  ? 1 year ago Annual physical exam  ? [facility] Glean Hess, MD  ? 2 years ago Encounter for screening for malignant neoplasm of colon  ? [facility] Glean Hess, MD  ?  ?  ?Future Appointments   ?        ? In 6 months [facility] Mel[facility] Jesse Sans, MD [facility] [place]  ?  ? ?  ?  ?  ? ? ?

## 2021-11-03 ENCOUNTER — Other Ambulatory Visit: Payer: Self-pay | Admitting: Internal Medicine

## 2021-11-03 DIAGNOSIS — K2100 Gastro-esophageal reflux disease with esophagitis, without bleeding: Secondary | ICD-10-CM

## 2021-11-04 NOTE — Telephone Encounter (Signed)
Requested Prescriptions  Pending Prescriptions Disp Refills  . lansoprazole (PREVACID) 30 MG capsule [Pharmacy Med Name: LANSOPRAZOLE '30MG'$  DR CAPSULES] 90 capsule 1    Sig: TAKE 1 CAPSULE BY MOUTH DAILY BEFORE A MEAL     Gastroenterology: Proton Pump Inhibitors 2 Passed - 11/03/2021  3:25 AM      Passed - ALT in normal range and within 360 days    ALT  Date Value Ref Range Status  02/20/2021 10 0 - 32 IU/L Final         Passed - AST in normal range and within 360 days    AST  Date Value Ref Range Status  02/20/2021 17 0 - 40 IU/L Final         Passed - Valid encounter within last 12 months    Recent Outpatient Visits          8 months ago Annual physical exam   [facility] Glean Hess, MD   1 year ago Essential (primary) hypertension   [facility] Glean Hess, MD   1 year ago Essential (primary) hypertension   [facility] Glean Hess, MD   1 year ago Annual physical exam   [facility] Glean Hess, MD   2 years ago Encounter for screening for malignant neoplasm of colon   [facility] Glean Hess, MD      Future Appointments            In 3 months Army Melia Jesse Sans, MD [facility], [facility]

## 2022-01-08 ENCOUNTER — Encounter: Payer: Self-pay | Admitting: Internal Medicine

## 2022-01-08 NOTE — Telephone Encounter (Signed)
Please review.  KP

## 2022-01-22 ENCOUNTER — Other Ambulatory Visit: Payer: Self-pay | Admitting: Internal Medicine

## 2022-01-22 DIAGNOSIS — H1013 Acute atopic conjunctivitis, bilateral: Secondary | ICD-10-CM

## 2022-01-22 NOTE — Telephone Encounter (Signed)
Appointment 02/26/22- patient will run out of medication before that appointment- ok RF Requested Prescriptions  Pending Prescriptions Disp Refills  . olopatadine (PATANOL) 0.1 % ophthalmic solution [Pharmacy Med Name: OLOPATADINE 0.1% OPTH SOLN 5ML] 15 mL 0    Sig: INSTILL 1 DROP IN BOTH EYES TWICE DAILY     Ophthalmology:  Antiallergy Passed - 01/22/2022 10:56 AM      Passed - Valid encounter within last 12 months    Recent Outpatient Visits          11 months ago Annual physical exam   [place] [facility] at [facility], Jesse Sans, MD   1 year ago Essential (primary) hypertension   [place] [facility] at [facility], Jesse Sans, MD   1 year ago Essential (primary) hypertension   [place] [facility] at [facility], Jesse Sans, MD   1 year ago Annual physical exam   [place] [facility] at [facility], Jesse Sans, MD   2 years ago Encounter for screening for malignant neoplasm of colon   [place] [facility] at [facility], Jesse Sans, MD      Future Appointments            In 1 month Army Melia, Jesse Sans, MD [place] [facility] at [facility], [facility]

## 2022-02-14 ENCOUNTER — Other Ambulatory Visit: Payer: Self-pay

## 2022-02-14 DIAGNOSIS — I10 Essential (primary) hypertension: Secondary | ICD-10-CM

## 2022-02-14 MED ORDER — HYDROCHLOROTHIAZIDE 25 MG PO TABS: 25.0000 mg | ORAL_TABLET | Freq: Every day | ORAL | 0 refills | Status: DC

## 2022-02-16 ENCOUNTER — Other Ambulatory Visit: Payer: Self-pay | Admitting: Internal Medicine

## 2022-02-16 DIAGNOSIS — I10 Essential (primary) hypertension: Secondary | ICD-10-CM

## 2022-02-17 NOTE — Telephone Encounter (Signed)
Overdue appointment- courtesy fill Requested Prescriptions  Pending Prescriptions Disp Refills  . hydrochlorothiazide (HYDRODIURIL) 25 MG tablet [Pharmacy Med Name: HYDROCHLOROTHIAZIDE '25MG'$  TABLETS] 90 tablet     Sig: TAKE 1 TABLET(25 MG) BY MOUTH DAILY     Cardiovascular: Diuretics - Thiazide Failed - 02/16/2022  2:33 PM      Failed - Cr in normal range and within 180 days    Creatinine, Ser  Date Value Ref Range Status  02/20/2021 0.88 0.57 - 1.00 mg/dL Final         Failed - K in normal range and within 180 days    Potassium  Date Value Ref Range Status  02/20/2021 4.0 3.5 - 5.2 mmol/L Final         Failed - Na in normal range and within 180 days    Sodium  Date Value Ref Range Status  02/20/2021 136 134 - 144 mmol/L Final         Failed - Valid encounter within last 6 months    Recent Outpatient Visits          12 months ago Annual physical exam   [place] [facility] at [facility], Jesse Sans, MD   1 year ago Essential (primary) hypertension   [place] [facility] at [facility], Jesse Sans, MD   1 year ago Essential (primary) hypertension   [place] [facility] at [facility], Jesse Sans, MD   2 years ago Annual physical exam   [place] [facility] at [facility], Jesse Sans, MD   3 years ago Encounter for screening for malignant neoplasm of colon   [place] [facility] at [facility], Jesse Sans, MD      Future Appointments            In 1 week Glean Hess, MD [facility] at [facility], [place] BP in normal range    BP Readings from Last 1 Encounters:  02/20/21 122/80

## 2022-02-18 DIAGNOSIS — Z1231 Encounter for screening mammogram for malignant neoplasm of breast: Secondary | ICD-10-CM | POA: Diagnosis not present

## 2022-02-19 DIAGNOSIS — H353232 Exudative age-related macular degeneration, bilateral, with inactive choroidal neovascularization: Secondary | ICD-10-CM | POA: Diagnosis not present

## 2022-02-19 DIAGNOSIS — H43813 Vitreous degeneration, bilateral: Secondary | ICD-10-CM | POA: Diagnosis not present

## 2022-02-26 ENCOUNTER — Ambulatory Visit (INDEPENDENT_AMBULATORY_CARE_PROVIDER_SITE_OTHER): Payer: Medicare PPO | Admitting: Internal Medicine

## 2022-02-26 ENCOUNTER — Encounter: Payer: Self-pay | Admitting: Internal Medicine

## 2022-02-26 VITALS — BP 150/88 | HR 76 | Ht 62.0 in | Wt 175.0 lb

## 2022-02-26 DIAGNOSIS — K2100 Gastro-esophageal reflux disease with esophagitis, without bleeding: Secondary | ICD-10-CM | POA: Diagnosis not present

## 2022-02-26 DIAGNOSIS — N952 Postmenopausal atrophic vaginitis: Secondary | ICD-10-CM

## 2022-02-26 DIAGNOSIS — I10 Essential (primary) hypertension: Secondary | ICD-10-CM | POA: Diagnosis not present

## 2022-02-26 DIAGNOSIS — Z86010 Personal history of colonic polyps: Secondary | ICD-10-CM | POA: Diagnosis not present

## 2022-02-26 DIAGNOSIS — E782 Mixed hyperlipidemia: Secondary | ICD-10-CM

## 2022-02-26 DIAGNOSIS — Z23 Encounter for immunization: Secondary | ICD-10-CM | POA: Diagnosis not present

## 2022-02-26 DIAGNOSIS — M5136 Other intervertebral disc degeneration, lumbar region: Secondary | ICD-10-CM

## 2022-02-26 DIAGNOSIS — Z0000 Encounter for general adult medical examination without abnormal findings: Secondary | ICD-10-CM | POA: Diagnosis not present

## 2022-02-26 LAB — POCT URINALYSIS DIPSTICK
Bilirubin, UA: NEGATIVE
Blood, UA: NEGATIVE
Glucose, UA: NEGATIVE
Ketones, UA: NEGATIVE
Leukocytes, UA: NEGATIVE
Nitrite, UA: NEGATIVE
Protein, UA: NEGATIVE
Spec Grav, UA: 1.01 (ref 1.010–1.025)
Urobilinogen, UA: 0.2 E.U./dL
pH, UA: 6 (ref 5.0–8.0)

## 2022-02-26 MED ORDER — HYDROCHLOROTHIAZIDE 25 MG PO TABS: 25.0000 mg | ORAL_TABLET | Freq: Every day | ORAL | 3 refills | Status: DC

## 2022-02-26 MED ORDER — ESTROGENS CONJUGATED 0.625 MG/GM VA CREA: 1.0000 | TOPICAL_CREAM | VAGINAL | 5 refills | Status: AC

## 2022-02-26 MED ORDER — OMEPRAZOLE 40 MG PO CPDR: 40.0000 mg | DELAYED_RELEASE_CAPSULE | Freq: Every day | ORAL | 3 refills | Status: DC

## 2022-02-26 MED ORDER — AMLODIPINE BESYLATE 2.5 MG PO TABS: 2.5000 mg | ORAL_TABLET | Freq: Every day | ORAL | 0 refills | Status: DC

## 2022-02-26 MED ORDER — FLUTICASONE PROPIONATE 50 MCG/ACT NA SUSP: 2.0000 | Freq: Two times a day (BID) | NASAL | 3 refills | Status: DC

## 2022-02-26 MED ORDER — AZELASTINE HCL 0.1 % NA SOLN: 1.0000 | Freq: Two times a day (BID) | NASAL | 3 refills | Status: DC

## 2022-02-26 NOTE — Progress Notes (Signed)
Date:  02/26/2022   Name:  Barbara West   DOB:  December 25, 1945   MRN:  [id number]   Chief Complaint: Annual Exam (Breast exam no pap) Barbara West is a 76 y.o. female who presents today for her Complete Annual Exam. She feels well. She reports exercising none. She reports she is sleeping well. Breast complaints none.  She is finally back in her home and settled.  Mammogram: 02/2022 DEXA: 06/2014 Normal Colonoscopy: 03/2019 repeat 5 yrs  Health Maintenance Due  Topic Date Due   TETANUS/TDAP  Never done   Zoster Vaccines- Shingrix (1 of 2) Never done    Immunization History  Administered Date(s) Administered   Fluad Quad(high Dose 65+) 02/16/2019, 02/17/2020, 02/20/2021, 02/26/2022   Influenza, High Dose Seasonal PF 02/13/2017, 01/11/2018   Influenza,inj,quad, With Preservative 02/13/2017   Influenza-Unspecified 03/06/2015, 02/06/2016, 02/20/2017   PFIZER Comirnaty(Gray Top)Covid-19 Tri-Sucrose Vaccine 06/17/2019, 07/08/2019   Pneumococcal Conjugate-13 02/20/2014   Pneumococcal Polysaccharide-23 04/23/2015    Hypertension This is a chronic problem. The problem is controlled (always in the 130's to 140). Pertinent negatives include no chest pain, headaches, palpitations or shortness of breath. Past treatments include diuretics and calcium channel blockers. There is no history of kidney disease, CAD/MI or CVA.  Gastroesophageal Reflux She complains of heartburn. She reports no abdominal pain, no chest pain, no coughing or no wheezing. This is a recurrent problem. The problem occurs occasionally. Pertinent negatives include no fatigue. She has tried a PPI for the symptoms. The treatment provided significant relief.  Sinus Problem This is a chronic problem. The problem is unchanged. Associated symptoms include congestion and sinus pressure. Pertinent negatives include no chills, coughing, headaches or shortness of breath. Treatments tried: Flonase and Astelin.    Lab Results   Component Value Date   NA 136 02/20/2021   K 4.0 02/20/2021   CO2 22 02/20/2021   GLUCOSE 90 02/20/2021   BUN 17 02/20/2021   CREATININE 0.88 02/20/2021   CALCIUM 9.6 02/20/2021   EGFR 68 02/20/2021   GFRNONAA 74 02/17/2020   Lab Results  Component Value Date   CHOL 282 (H) 02/20/2021   HDL 78 02/20/2021   LDLCALC 179 (H) 02/20/2021   TRIG 142 02/20/2021   CHOLHDL 3.6 02/20/2021   Lab Results  Component Value Date   TSH 2.240 02/20/2021   No results found for: "HGBA1C" Lab Results  Component Value Date   WBC 13.1 (H) 02/20/2021   HGB 14.6 02/20/2021   HCT 41.7 02/20/2021   MCV 85 02/20/2021   PLT 415 02/20/2021   Lab Results  Component Value Date   ALT 10 02/20/2021   AST 17 02/20/2021   ALKPHOS 140 (H) 02/20/2021   BILITOT 0.3 02/20/2021   Lab Results  Component Value Date   VD25OH 39.5 02/17/2020     Review of Systems  Constitutional:  Negative for chills, fatigue and fever.  HENT:  Positive for congestion and sinus pressure. Negative for hearing loss, tinnitus, trouble swallowing and voice change.   Eyes:  Negative for visual disturbance.  Respiratory:  Negative for cough, chest tightness, shortness of breath and wheezing.   Cardiovascular:  Negative for chest pain, palpitations and leg swelling.  Gastrointestinal:  Positive for heartburn. Negative for abdominal pain, constipation, diarrhea and vomiting.  Endocrine: Negative for polydipsia and polyuria.  Genitourinary:  Negative for dysuria, frequency, genital sores, vaginal bleeding and vaginal discharge.  Musculoskeletal:  Positive for arthralgias, gait problem and joint swelling.  Skin:  Negative for color change and rash.  Neurological:  Negative for dizziness, tremors, light-headedness and headaches.  Hematological:  Negative for adenopathy. Does not bruise/bleed easily.  Psychiatric/Behavioral:  Negative for dysphoric mood and sleep disturbance. The patient is not nervous/anxious.     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   Disability of walking 12/15/2016   Trochanteric bursitis of both hips 09/05/2016   Atrophic vaginitis 04/23/2015   Avitaminosis D 01/22/2015   Allergic rhinitis, seasonal 01/22/2015   Mixed hyperlipidemia 01/22/2015   Junctional nevus of multiple sites 01/22/2015   Essential (primary) hypertension 01/22/2015   Gastroesophageal reflux disease with esophagitis 01/22/2015    Allergies  Allergen Reactions   Celecoxib Anaphylaxis   Atorvastatin    Clarithromycin Other (See Comments)   Codeine Other (See Comments), Nausea Only and Nausea And Vomiting   Losartan Potassium Other (See Comments)    Pt does not believe this is correct but will not remove since it was more than 5 years ago   Morphine And Related Other (See Comments), Nausea Only and Nausea And Vomiting   Shellfish Allergy Nausea Only   Sulfa Antibiotics Nausea And Vomiting   Lisinopril Rash    Red skin   Mupirocin Rash    Cream/ointment.   Penicillins Rash    Past Surgical History:  Procedure Laterality Date   CATARACT EXTRACTION Bilateral    ESOPHAGOGASTRODUODENOSCOPY  2010   [place]   EYE SURGERY  cateract   HIP SURGERY Right 06/2019   JOINT REPLACEMENT  2009   bilateral   REPLACEMENT TOTAL KNEE BILATERAL Bilateral     Social History   Tobacco Use   Smoking status: Never   Smokeless tobacco: Never   Tobacco comments:    Never used  Vaping Use   Vaping Use: Never used  Substance Use Topics   Alcohol use: Not Currently    Alcohol/week: 1.0 standard drink of alcohol    Types: 1 Glasses of wine per week    Comment: rarely   Drug use: No     Medication list has been reviewed and updated.  Current Meds   Medication Sig   albuterol (VENTOLIN HFA) 108 (90 Base) MCG/ACT inhaler Inhale 2 puffs into the lungs every 6 (six) hours as needed for wheezing or shortness of breath.   amLODipine (NORVASC) 2.5 MG tablet Take 1 tablet (2.5 mg total) by mouth daily.   aspirin EC 81 MG tablet Take 81 mg by mouth daily.   cholecalciferol (VITAMIN D) 400 units TABS tablet Take 400 Units by mouth daily.   conjugated estrogens (PREMARIN) vaginal cream Place 1 Applicatorful vaginally daily.   fexofenadine (ALLEGRA) 180 MG tablet Take 180 mg by mouth daily.    fluocinonide cream (LIDEX) 7.85 % Apply 1 application topically as needed.   fluticasone (FLONASE) 50 MCG/ACT nasal spray SHAKE LIQUID AND USE 2 SPRAYS IN EACH NOSTRIL TWICE DAILY   Ibuprofen 200 MG CAPS Take 1 capsule by mouth as needed.    lidocaine (LIDODERM) 5 % Place 1 patch onto the skin daily as needed.   meclizine (ANTIVERT) 25 MG tablet Take 25 mg by mouth 3 (three) times daily as needed for dizziness.   multivitamin-lutein (OCUVITE-LUTEIN) CAPS capsule Take 1 capsule by mouth daily.  olopatadine (PATANOL) 0.1 % ophthalmic solution INSTILL 1 DROP IN BOTH EYES TWICE DAILY   omeprazole (PRILOSEC) 40 MG capsule Take 1 capsule (40 mg total) by mouth daily.   [DISCONTINUED] azelastine (ASTELIN) 0.1 % nasal spray Place 1 spray into both nostrils 2 (two) times daily.   [DISCONTINUED] Azelastine-Fluticasone 137-50 MCG/ACT SUSP Place 1 spray into both nostrils daily.   [DISCONTINUED] fluticasone (FLONASE) 50 MCG/ACT nasal spray Place 2 sprays into both nostrils 2 (two) times daily.   [DISCONTINUED] hydrochlorothiazide (HYDRODIURIL) 25 MG tablet Take 1 tablet (25 mg total) by mouth daily.   [DISCONTINUED] lansoprazole (PREVACID) 30 MG capsule TAKE 1 CAPSULE BY MOUTH DAILY BEFORE A MEAL   [DISCONTINUED] meloxicam (MOBIC) 15 MG tablet Take 1 tablet by mouth daily.       02/26/2022   10:05 AM 02/20/2021   10:05 AM 10/24/2020    1:39 PM 05/07/2020    1:42 PM   GAD 7 : Generalized Anxiety Score  Nervous, Anxious, on Edge 0 0 0 0  Control/stop worrying 0 0 0 0  Worry too much - different things 0 0 0 0  Trouble relaxing 0 0 0 0  Restless 0 0 0 0  Easily annoyed or irritable 0 0 0 0  Afraid - awful might happen 0 0 0 0  Total GAD 7 Score 0 0 0 0  Anxiety Difficulty Not difficult at all Not difficult at all Not difficult at all        02/26/2022   10:05 AM 05/20/2021    1:41 PM 02/20/2021   10:05 AM  Depression screen PHQ 2/9  Decreased Interest 0 0 0  Down, Depressed, Hopeless 0 0 0  PHQ - 2 Score 0 0 0  Altered sleeping 0  0  Tired, decreased energy 0  0  Change in appetite 0  0  Feeling bad or failure about yourself  0  0  Trouble concentrating 0  0  Moving slowly or fidgety/restless 0  0  Suicidal thoughts 0  0  PHQ-9 Score 0  0  Difficult doing work/chores Not difficult at all      BP Readings from Last 3 Encounters:  02/26/22 (!) 150/88  02/20/21 122/80  10/24/20 128/82    Physical Exam Vitals and nursing note reviewed.  Constitutional:      General: She is not in acute distress.    Appearance: She is well-developed.  HENT:     Head: Normocephalic and atraumatic.     Right Ear: Tympanic membrane and ear canal normal.     Left Ear: Tympanic membrane and ear canal normal.     Nose:     Right Sinus: No maxillary sinus tenderness.     Left Sinus: No maxillary sinus tenderness.  Eyes:     General: No scleral icterus.       Right eye: No discharge.        Left eye: No discharge.     Conjunctiva/sclera: Conjunctivae normal.  Neck:     Thyroid: No thyromegaly.     Vascular: No carotid bruit.  Cardiovascular:     Rate and Rhythm: Normal rate and regular rhythm.     Pulses: Normal pulses.     Heart sounds: Normal heart sounds.  Pulmonary:     Effort: Pulmonary effort is normal. No respiratory distress.     Breath sounds: No wheezing.  Abdominal:     General: Bowel sounds are normal.     Palpations: Abdomen is  soft.     Tenderness: There is no abdominal tenderness.  Musculoskeletal:     Cervical back: Normal range of motion. No erythema.     Right lower leg: No edema.     Left lower leg: No edema.  Lymphadenopathy:     Cervical: No cervical adenopathy.  Skin:    General: Skin is warm and dry.     Findings: No rash.  Neurological:     Mental Status: She is alert and oriented to person, place, and time.     Cranial Nerves: No cranial nerve deficit.     Sensory: No sensory deficit.     Deep Tendon Reflexes: Reflexes are normal and symmetric.  Psychiatric:        Attention and Perception: Attention normal.        Mood and Affect: Mood normal.     Wt Readings from Last 3 Encounters:  02/26/22 175 lb (79.4 kg)  02/20/21 182 lb 3.2 oz (82.6 kg)  10/24/20 184 lb (83.5 kg)    BP (!) 150/88 (BP Location: Right Arm)   Pulse 76   Ht 5' 2"  (1.575 m)   Wt 175 lb (79.4 kg)   SpO2 96%   BMI 32.01 kg/m   Assessment and Plan: 1. Annual physical exam Exam is normal except for weight. Encourage regular exercise and appropriate dietary changes. Up to date on screenings and immunizations. Premarin cream refilled  2. Essential (primary) hypertension BP not controlled on HCTZ alone. Will add low dose amlodipine due to hx of ACE/ARB intolerance Monitor BP at home and follow up > 130/80 or side effects and in 6 months - CBC with Differential/Platelet - Comprehensive metabolic panel - TSH - POCT urinalysis dipstick - amLODipine (NORVASC) 2.5 MG tablet; Take 1 tablet (2.5 mg total) by mouth daily.  Dispense: 90 tablet; Refill: 0 - hydrochlorothiazide (HYDRODIURIL) 25 MG tablet; Take 1 tablet (25 mg total) by mouth daily.  Dispense: 90 tablet; Refill: 3  3. Gastroesophageal reflux disease with esophagitis without hemorrhage Symptoms well controlled on daily PPI but prefers omeprazole No red flag signs such as weight loss, n/v, melena Will continue PPI but change from Prevacid to Omeprazole. -  CBC with Differential/Platelet - omeprazole (PRILOSEC) 40 MG capsule; Take 1 capsule (40 mg total) by mouth daily.  Dispense: 90 capsule; Refill: 3  4. Mixed hyperlipidemia Continue  low fat diet; check labs - Lipid panel  5. Degeneration of lumbar intervertebral disc Uses rollator for ambulation On Advil as needed  6. Hx of colonic polyps Last done in 2020 and her letter says repeat in 3 yrs Unable to verify pathology of the polyps so will refer to [place] - Ambulatory referral to Gastroenterology  7. Need for immunization against influenza - Flu Vaccine QUAD High Dose(Fluad)   Partially dictated using Editor, commissioning. Any errors are unintentional.  Halina Maidens, MD [facility]  02/26/2022

## 2022-02-26 NOTE — Patient Instructions (Signed)
Follow up in 6 mo for BP unless you are having side effects or are not controlled ( <130/80) after 6 weeks.  Then see me sooner.

## 2022-02-27 LAB — COMPREHENSIVE METABOLIC PANEL
ALT: 8 IU/L (ref 0–32)
AST: 18 IU/L (ref 0–40)
Albumin/Globulin Ratio: 1.5 (ref 1.2–2.2)
Albumin: 4.4 g/dL (ref 3.8–4.8)
Alkaline Phosphatase: 117 IU/L (ref 44–121)
BUN/Creatinine Ratio: 17 (ref 12–28)
BUN: 14 mg/dL (ref 8–27)
Bilirubin Total: 0.2 mg/dL (ref 0.0–1.2)
CO2: 24 mmol/L (ref 20–29)
Calcium: 9.7 mg/dL (ref 8.7–10.3)
Chloride: 97 mmol/L (ref 96–106)
Creatinine, Ser: 0.81 mg/dL (ref 0.57–1.00)
Globulin, Total: 2.9 g/dL (ref 1.5–4.5)
Glucose: 96 mg/dL (ref 70–99)
Potassium: 4.2 mmol/L (ref 3.5–5.2)
Sodium: 136 mmol/L (ref 134–144)
Total Protein: 7.3 g/dL (ref 6.0–8.5)
eGFR: 75 mL/min/{1.73_m2} (ref >59)

## 2022-02-27 LAB — CBC WITH DIFFERENTIAL/PLATELET
Basophils Absolute: 0.1 10*3/uL (ref 0.0–0.2)
Basos: 1 %
EOS (ABSOLUTE): 0.4 10*3/uL (ref 0.0–0.4)
Eos: 4 %
Hematocrit: 40.8 % (ref 34.0–46.6)
Hemoglobin: 13.9 g/dL (ref 11.1–15.9)
Immature Grans (Abs): 0 10*3/uL (ref 0.0–0.1)
Immature Granulocytes: 0 %
Lymphocytes Absolute: 4.2 10*3/uL — ABNORMAL HIGH (ref 0.7–3.1)
Lymphs: 35 %
MCH: 29.5 pg (ref 26.6–33.0)
MCHC: 34.1 g/dL (ref 31.5–35.7)
MCV: 87 fL (ref 79–97)
Monocytes Absolute: 1.4 10*3/uL — ABNORMAL HIGH (ref 0.1–0.9)
Monocytes: 11 %
Neutrophils Absolute: 5.8 10*3/uL (ref 1.4–7.0)
Neutrophils: 49 %
Platelets: 415 10*3/uL (ref 150–450)
RBC: 4.71 x10E6/uL (ref 3.77–5.28)
RDW: 12.2 % (ref 11.7–15.4)
WBC: 11.9 10*3/uL — ABNORMAL HIGH (ref 3.4–10.8)

## 2022-02-27 LAB — LIPID PANEL
Chol/HDL Ratio: 4.1 ratio (ref 0.0–4.4)
Cholesterol, Total: 297 mg/dL — ABNORMAL HIGH (ref 100–199)
HDL: 73 mg/dL (ref >39)
LDL Chol Calc (NIH): 184 mg/dL — ABNORMAL HIGH (ref 0–99)
Triglycerides: 213 mg/dL — ABNORMAL HIGH (ref 0–149)
VLDL Cholesterol Cal: 40 mg/dL (ref 5–40)

## 2022-02-27 LAB — TSH: TSH: 2.25 u[IU]/mL (ref 0.450–4.500)

## 2022-02-28 ENCOUNTER — Other Ambulatory Visit: Payer: Self-pay | Admitting: Internal Medicine

## 2022-02-28 DIAGNOSIS — E782 Mixed hyperlipidemia: Secondary | ICD-10-CM

## 2022-02-28 MED ORDER — ATORVASTATIN CALCIUM 10 MG PO TABS: 10.0000 mg | ORAL_TABLET | Freq: Every day | ORAL | 1 refills | Status: DC

## 2022-02-28 MED ORDER — PRAVASTATIN SODIUM 10 MG PO TABS: 10.0000 mg | ORAL_TABLET | Freq: Every day | ORAL | 1 refills | Status: DC

## 2022-03-17 ENCOUNTER — Encounter: Payer: Self-pay | Admitting: Internal Medicine

## 2022-03-17 NOTE — Telephone Encounter (Signed)
Please review.  KP

## 2022-03-23 ENCOUNTER — Other Ambulatory Visit: Payer: Self-pay | Admitting: Internal Medicine

## 2022-03-23 DIAGNOSIS — I10 Essential (primary) hypertension: Secondary | ICD-10-CM

## 2022-03-23 MED ORDER — SPIRONOLACTONE 25 MG PO TABS: 25.0000 mg | ORAL_TABLET | Freq: Every day | ORAL | 3 refills | Status: DC

## 2022-03-25 ENCOUNTER — Encounter: Payer: Self-pay | Admitting: Internal Medicine

## 2022-04-11 DIAGNOSIS — D225 Melanocytic nevi of trunk: Secondary | ICD-10-CM | POA: Diagnosis not present

## 2022-04-11 DIAGNOSIS — Z1283 Encounter for screening for malignant neoplasm of skin: Secondary | ICD-10-CM | POA: Diagnosis not present

## 2022-04-11 DIAGNOSIS — L821 Other seborrheic keratosis: Secondary | ICD-10-CM | POA: Diagnosis not present

## 2022-04-11 DIAGNOSIS — L820 Inflamed seborrheic keratosis: Secondary | ICD-10-CM | POA: Diagnosis not present

## 2022-04-11 DIAGNOSIS — L812 Freckles: Secondary | ICD-10-CM | POA: Diagnosis not present

## 2022-04-11 DIAGNOSIS — L814 Other melanin hyperpigmentation: Secondary | ICD-10-CM | POA: Diagnosis not present

## 2022-04-11 DIAGNOSIS — D485 Neoplasm of uncertain behavior of skin: Secondary | ICD-10-CM | POA: Diagnosis not present

## 2022-04-11 DIAGNOSIS — B078 Other viral warts: Secondary | ICD-10-CM | POA: Diagnosis not present

## 2022-04-11 DIAGNOSIS — Z85828 Personal history of other malignant neoplasm of skin: Secondary | ICD-10-CM | POA: Diagnosis not present

## 2022-04-11 DIAGNOSIS — L570 Actinic keratosis: Secondary | ICD-10-CM | POA: Diagnosis not present

## 2022-04-25 ENCOUNTER — Telehealth: Payer: Self-pay

## 2022-04-25 NOTE — Telephone Encounter (Signed)
Attempted to contact the patient concerning her AWV appt scheduled for 05/21/22. I wanted to see if we could reschedule that appt on Friday, January 19th or Monday, January 22nd.

## 2022-05-17 ENCOUNTER — Other Ambulatory Visit: Payer: Self-pay | Admitting: Internal Medicine

## 2022-05-17 DIAGNOSIS — I10 Essential (primary) hypertension: Secondary | ICD-10-CM

## 2022-05-19 ENCOUNTER — Encounter: Payer: Self-pay | Admitting: Internal Medicine

## 2022-05-19 NOTE — Telephone Encounter (Signed)
Refilled 02/26/2022 #[id number] rf. Requested Prescriptions  Refused Prescriptions Disp Refills   hydrochlorothiazide (HYDRODIURIL) 25 MG tablet [Pharmacy Med Name: HYDROCHLOROTHIAZIDE '25MG'$  TABLETS] 30 tablet     Sig: TAKE 1 TABLET(25 MG) BY MOUTH DAILY     Cardiovascular: Diuretics - Thiazide Failed - 05/17/2022  3:43 AM      Failed - Last BP in normal range    BP Readings from Last 1 Encounters:  02/26/22 (!) 150/88         Passed - Cr in normal range and within 180 days    Creatinine, Ser  Date Value Ref Range Status  02/26/2022 0.81 0.57 - 1.00 mg/dL Final         Passed - K in normal range and within 180 days    Potassium  Date Value Ref Range Status  02/26/2022 4.2 3.5 - 5.2 mmol/L Final         Passed - Na in normal range and within 180 days    Sodium  Date Value Ref Range Status  02/26/2022 136 134 - 144 mmol/L Final         Passed - Valid encounter within last 6 months    Recent Outpatient Visits           2 months ago Annual physical exam   [place] [facility] at [facility], Jesse Sans, MD   1 year ago Annual physical exam   [place] [facility] at [facility], Jesse Sans, MD   1 year ago Essential (primary) hypertension   [place] [facility] at [facility], Jesse Sans, MD   2 years ago Essential (primary) hypertension   [place] [facility] at [facility], Jesse Sans, MD   2 years ago Annual physical exam   [facility] at [facility], Jesse Sans, MD       Future Appointments             In 3 months Army Melia, Jesse Sans, MD [facility] at [facility], [facility]   In 9 months Army Melia, Jesse Sans, MD [place] [facility] at [facility], [facility]

## 2022-05-21 ENCOUNTER — Ambulatory Visit: Payer: Medicare PPO

## 2022-05-21 DIAGNOSIS — L600 Ingrowing nail: Secondary | ICD-10-CM | POA: Diagnosis not present

## 2022-05-23 ENCOUNTER — Ambulatory Visit (INDEPENDENT_AMBULATORY_CARE_PROVIDER_SITE_OTHER): Payer: Medicare PPO

## 2022-05-23 DIAGNOSIS — Z0000 Encounter for general adult medical examination without abnormal findings: Secondary | ICD-10-CM

## 2022-05-23 NOTE — Progress Notes (Signed)
I connected with  Barbara West on 05/23/22 by a audio enabled telemedicine application and verified that I am speaking with the correct person using two identifiers.  Patient Location: Home  Provider Location: Office/Clinic  I discussed the limitations of evaluation and management by telemedicine. The patient expressed understanding and agreed to proceed.  Subjective:   Barbara West is a 77 y.o. female who presents for Medicare Annual (Subsequent) preventive examination.  Review of Systems    Per HPI unless specifically indicated below.  Cardiac Risk Factors include: advanced age (>61mn, >>71women);female gender, Essential Hypertension, and Mixed Hyperlipidemia.         Objective:       02/26/2022   10:06 AM 02/26/2022    9:49 AM 02/20/2021   10:00 AM  Vitals with BMI  Height  '5\' 2"'$  '5\' 2"'$   Weight  175 lbs 182 lbs 3 oz  BMI  32 340.08 Systolic [id number] Diastolic 88 90 80  Pulse  76 76    There were no vitals filed for this visit. There is no height or weight on file to calculate BMI.     05/23/2022    2:53 PM 05/20/2021    1:43 PM 03/12/2020   10:21 AM 10/06/2018    1:40 PM 09/16/2017    2:03 PM 09/05/2016   10:40 AM 07/11/2015    1:41 PM  Advanced Directives  Does Patient Have a Medical Advance Directive? Yes Yes Yes Yes Yes Yes Yes  Type of AParamedicof [place]Living will [place]Living will HSwaled[place]Living will HFrench SettlementLiving will HTacnaLiving will HRed Feather LakesLiving will HFort BridgerLiving will  Does patient want to make changes to medical advance directive? No - Patient declined        Copy of HMachiasin Chart? No - copy requested No - copy requested No - copy requested No - copy requested No - copy requested No - copy requested     Current Medications (verified) Outpatient Encounter Medications  as of 05/23/2022  Medication Sig   albuterol (VENTOLIN HFA) 108 (90 Base) MCG/ACT inhaler Inhale 2 puffs into the lungs every 6 (six) hours as needed for wheezing or shortness of breath.   aspirin EC 81 MG tablet Take 81 mg by mouth daily.   azelastine (ASTELIN) 0.1 % nasal spray Place 1 spray into both nostrils 2 (two) times daily.   cholecalciferol (VITAMIN D) 400 units TABS tablet Take 400 Units by mouth daily.   conjugated estrogens (PREMARIN) vaginal cream Place 1 Applicatorful vaginally 3 (three) times a week.   fexofenadine (ALLEGRA) 180 MG tablet Take 180 mg by mouth daily.    fluocinonide cream (LIDEX) 02.67% Apply 1 application topically as needed.   fluticasone (FLONASE) 50 MCG/ACT nasal spray Place 2 sprays into both nostrils 2 (two) times daily.   Ibuprofen 200 MG CAPS Take 1 capsule by mouth as needed.    lidocaine (LIDODERM) 5 % Place 1 patch onto the skin daily as needed.   meclizine (ANTIVERT) 25 MG tablet Take 25 mg by mouth 3 (three) times daily as needed for dizziness.   multivitamin-lutein (OCUVITE-LUTEIN) CAPS capsule Take 1 capsule by mouth daily.   olopatadine (PATANOL) 0.1 % ophthalmic solution INSTILL 1 DROP IN BOTH EYES TWICE DAILY   omeprazole (PRILOSEC) 40 MG capsule Take 1 capsule (40 mg total) by mouth daily.  pravastatin (PRAVACHOL) 10 MG tablet Take 1 tablet (10 mg total) by mouth daily.   spironolactone (ALDACTONE) 25 MG tablet Take 1 tablet (25 mg total) by mouth daily.   gabapentin (NEURONTIN) 100 MG capsule Take 1 capsule by mouth at bedtime. (Patient not taking: Reported on 02/26/2022)   [DISCONTINUED] amLODipine (NORVASC) 2.5 MG tablet Take 1 tablet (2.5 mg total) by mouth daily. (Patient not taking: Reported on 05/23/2022)   [DISCONTINUED] fluticasone (FLONASE) 50 MCG/ACT nasal spray SHAKE LIQUID AND USE 2 SPRAYS IN EACH NOSTRIL TWICE DAILY (Patient not taking: Reported on 05/23/2022)   [DISCONTINUED] hydrochlorothiazide (HYDRODIURIL) 25 MG tablet Take 1  tablet (25 mg total) by mouth daily. (Patient not taking: Reported on 05/23/2022)   No facility-administered encounter medications on file as of 05/23/2022.    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 Virginia Rochester - [facility]; on slow prednisone taper   Seasonal allergies    Trochanteric bursitis    Vertigo    Vitamin D deficiency    Past Surgical History:  Procedure Laterality Date   CATARACT EXTRACTION Bilateral    ESOPHAGOGASTRODUODENOSCOPY  2010   [place]   HIP SURGERY Right 06/2019   JOINT REPLACEMENT  2009   bilateral   REPLACEMENT TOTAL KNEE BILATERAL Bilateral    Family History  Problem Relation Age of Onset   Alzheimer's disease Mother    Vision loss Father    Heart disease Brother    Social History   Socioeconomic History   Marital status: Married    Spouse name: Lamaria Hildebrandt   Number of children: 0   Years of education: 14   Highest education level: Associate degree: occupational, Hotel manager, or vocational program  Occupational History   Occupation: Retired  Tobacco Use   Smoking status: Never   Smokeless tobacco: Never   Tobacco comments:    Never used  Vaping Use   Vaping Use: Never used  Substance and Sexual Activity   Alcohol use: Yes    Alcohol/week: 1.0 standard drink of alcohol    Types: 1 Glasses of wine per week    Comment: rarely   Drug use: No   Sexual activity: Yes    Partners: Male    Birth control/protection: None  Other Topics Concern   Not on file  Social History Narrative   Not on file   Social Determinants of  Health   Financial Resource Strain: Low Risk  (05/23/2022)   Overall Financial Resource Strain (CARDIA)    Difficulty of Paying Living Expenses: Not hard at all  Food Insecurity: No Food Insecurity (05/23/2022)   Hunger Vital Sign    Worried About Running Out of Food in the Last Year: Never true    Ran Out of Food in the Last Year: Never true  Transportation Needs: No Transportation Needs (05/23/2022)   PRAPARE - Hydrologist (Medical): No    Lack of Transportation (Non-Medical): No  Physical Activity: Inactive (05/23/2022)   Exercise Vital Sign    Days of Exercise per Week: 0 days    Minutes of Exercise per Session: 0 min  Stress: No Stress Concern Present (05/23/2022)   [place]    Feeling of Stress : Not at all  Social Connections: Moderately Isolated (05/23/2022)   Social Connection and Isolation Panel [NHANES]    Frequency of Communication with Friends and Family: More than three times a week    Frequency of Social Gatherings with Friends and Family: Once a week    Attends Religious Services: Never    Marine scientist or Organizations: No    Attends Music therapist: Never    Marital Status: Married    Tobacco Counseling Counseling given: No Tobacco comments: Never used   Clinical Intake:     Pain : No/denies pain     Nutritional Status: BMI of 19-24  Normal Nutritional Risks: None Diabetes: No  How often do you need to have someone help you when you read instructions, pamphlets, or other written materials from your doctor or pharmacy?: 1 - Never  Diabetic?No     Information entered by :: Donnie Mesa, CMA   Activities of Daily Living    05/19/2022    9:18 AM  In your present state of health, do you have any difficulty performing the following activities:  Hearing? 0  Vision? 0  Difficulty concentrating or making decisions? 0  Walking or  climbing stairs? 0  Dressing or bathing? 0  Doing errands, shopping? 0  Preparing Food and eating ? N  Using the Toilet? N  In the past six months, have you accidently leaked urine? N  Do you have problems with loss of bowel control? N  Managing your Medications? N  Managing your Finances? N  Housekeeping or managing your Housekeeping? N    Patient Care Team: Glean Hess, MD as PCP - General (Internal Medicine)  Indicate any recent Medical Services you may have received from other than [facility] providers in the past year (date may be approximate).     Assessment:   This is a routine wellness examination for [place].  Hearing/Vision screen Hearing Screening - Comments:: Pt denies hearing difficulty Vision Screening - Comments:: Annual vision screenings with Dr. Becky [facility]a in [facility] Dietary issues and exercise activities discussed: Current Exercise Habits: The patient does not participate in regular exercise at present, Exercise limited by: None identified   Goals Addressed   None    Depression Screen    05/23/2022    1:57 PM 05/23/2022    1:56 PM 02/26/2022   10:05 AM 05/20/2021    1:41 PM 02/20/2021   10:05 AM 10/24/2020    1:39 PM 05/07/2020    1:42 PM  PHQ 2/9 Scores  PHQ - 2 Score 0 0 0 0 0 0 0  PHQ- 9 Score   0  0 0 0    Fall Risk    05/23/2022    1:57 PM 05/19/2022    9:18 AM 02/26/2022   10:06 AM 05/20/2021    1:44 PM 02/20/2021   10:06 AM  [place] in the past year? 0 0 0 0 0  Number falls in past yr: 0  0 0 0  Injury with Fall? 0  0 0 0  Risk for fall due to : No Fall Risks  No Fall Risks No Fall Risks Impaired balance/gait  Follow up Falls evaluation completed  Falls evaluation completed Falls prevention discussed Falls evaluation completed    FALL RISK PREVENTION PERTAINING TO THE HOME:  Any stairs in  or around the home? Yes  If so, are there any without handrails? No  Home free of loose throw rugs in walkways, pet beds, electrical cords, etc?  Yes  Adequate lighting in your home to reduce risk of falls? Yes   ASSISTIVE DEVICES UTILIZED TO PREVENT FALLS:  Life alert? No  Use of a cane, walker or w/c? Yes  Grab bars in the bathroom? Yes  Shower chair or bench in shower? Yes  Elevated toilet seat or a handicapped toilet? No   TIMED UP AND GO:  Was the test performed?  unable to perform, virtual telephone visit  .  Cognitive Function:        05/23/2022    1:58 PM 10/06/2018    1:43 PM 09/16/2017    2:06 PM 09/05/2016   10:43 AM  6CIT Screen  What Year? 0 points 0 points 0 points 0 points  What month? 0 points 0 points 0 points 0 points  What time? 0 points 0 points 0 points 0 points  Count back from 20 0 points 0 points 0 points 0 points  Months in reverse 0 points 0 points 0 points 0 points  Repeat phrase 0 points 0 points 0 points 0 points  Total Score 0 points 0 points 0 points 0 points    Immunizations Immunization History  Administered Date(s) Administered   Fluad Quad(high Dose 65+) 02/16/2019, 02/17/2020, 02/20/2021, 02/26/2022   Influenza, High Dose Seasonal PF 02/13/2017, 01/11/2018   Influenza,inj,quad, With Preservative 02/13/2017   Influenza-Unspecified 03/06/2015, 02/06/2016, 02/20/2017   PFIZER Comirnaty(Gray Top)Covid-19 Tri-Sucrose Vaccine 06/17/2019, 07/08/2019   Pneumococcal Conjugate-13 02/20/2014   Pneumococcal Polysaccharide-23 04/23/2015    TDAP status: Due, Education has been provided regarding the importance of this vaccine. Advised may receive this vaccine at local pharmacy or Health Dept. Aware to provide a copy of the vaccination record if obtained from local pharmacy or Health Dept. Verbalized acceptance and understanding.  Flu Vaccine status: Up to date  Pneumococcal vaccine status: Up to date  Covid-19 vaccine status: Information provided on how to obtain vaccines.   Qualifies for Shingles Vaccine? Yes   Zostavax completed No   Shingrix Completed?: No.    Education has been  provided regarding the importance of this vaccine. Patient has been advised to call insurance company to determine out of pocket expense if they have not yet received this vaccine. Advised may also receive vaccine at local pharmacy or Health Dept. Verbalized acceptance and understanding.  Screening Tests Health Maintenance  Topic Date Due   DTaP/Tdap/Td (1 - Tdap) Never done   Zoster Vaccines- Shingrix (1 of 2) Never done   COVID-19 Vaccine (3 - 2023-24 season) 01/03/2022   MAMMOGRAM  02/19/2023   Medicare Annual Wellness (AWV)  05/24/2023   COLONOSCOPY (Pts 45-60yr Insurance coverage will need to be confirmed)  03/23/2024   Pneumonia Vaccine 77 Years old  Completed   INFLUENZA VACCINE  Completed   DEXA SCAN  Completed   Hepatitis C Screening  Completed   HPV VACCINES  Aged Out    Health Maintenance  Health Maintenance Due  Topic Date Due   DTaP/Tdap/Td (1 - Tdap) Never done   Zoster Vaccines- Shingrix (1 of 2) Never done   COVID-19 Vaccine (3 - 2023-24 season) 01/03/2022    Colorectal cancer screening: Type of screening: Colonoscopy. Completed 03/24/2019. Repeat every 5 years  Mammogram status: Completed 02/18/2022. Repeat every year  DEXA Scan: 06/30/2014  Lung Cancer Screening: (Low Dose CT Chest recommended if  Age 29-80 years, 30 pack-year currently smoking OR have quit w/in 15years.) does not qualify.   Lung Cancer Screening Referral: not applicable   Additional Screening:  Hepatitis C Screening: does qualify; Completed 04/23/2015  Vision Screening: Recommended annual ophthalmology exams for early detection of glaucoma and other disorders of the eye. Is the patient up to date with their annual eye exam?  Yes  Who is the provider or what is the name of the office in which the patient attends annual eye exams? Dr. Becky Augusta in [place] If pt is not established with a provider, would they like to be referred to a provider to establish care? No .   Dental Screening:  Recommended annual dental exams for proper oral hygiene  Community Resource Referral / Chronic Care Management: CRR required this visit?  No   CCM required this visit?  No      Plan:     I have personally reviewed and noted the following in the patient's chart:   Medical and social history Use of alcohol, tobacco or illicit drugs  Current medications and supplements including opioid prescriptions. Patient is not currently taking opioid prescriptions. Functional ability and status Nutritional status Physical activity Advanced directives List of other physicians Hospitalizations, surgeries, and ER visits in previous 12 months Vitals Screenings to include cognitive, depression, and falls Referrals and appointments  In addition, I have reviewed and discussed with patient certain preventive protocols, quality metrics, and best practice recommendations. A written personalized care plan for preventive services as well as general preventive health recommendations were provided to patient.    Ms. Sandiford , Thank you for taking time to come for your Medicare Wellness Visit. I appreciate your ongoing commitment to your health goals. Please review the following plan we discussed and let me know if I can assist you in the future.   These are the goals we discussed:  Goals      Decrease the likelihood of falling     Plans to [facility]/exercise programs to help with balance and endurance   Goal Met and she is in medical wellness program.     DIET - INCREASE WATER INTAKE     Recommend to drink at least 6-8 8oz glasses of water per day.        This is a list of the screening recommended for you and due dates:  Health Maintenance  Topic Date Due   DTaP/Tdap/Td vaccine (1 - Tdap) Never done   Zoster (Shingles) Vaccine (1 of 2) Never done   COVID-19 Vaccine (3 - 2023-24 season) 01/03/2022   Mammogram  02/19/2023   Medicare Annual Wellness Visit  05/24/2023   Colon  Cancer Screening  03/23/2024   Pneumonia Vaccine  Completed   Flu Shot  Completed   DEXA scan (bone density measurement)  Completed   Hepatitis C Screening: USPSTF Recommendation to screen - Ages 85-79 yo.  Completed   HPV Vaccine  Aged [address] [place]   05/23/2022   Nurse Notes: Approximately 30 minute Non-Face -To-Face Medicare Wellness Visit

## 2022-05-23 NOTE — Patient Instructions (Signed)

## 2022-06-13 ENCOUNTER — Other Ambulatory Visit: Payer: Self-pay | Admitting: Internal Medicine

## 2022-06-13 DIAGNOSIS — H1013 Acute atopic conjunctivitis, bilateral: Secondary | ICD-10-CM

## 2022-06-13 NOTE — Telephone Encounter (Signed)
Requested Prescriptions  Pending Prescriptions Disp Refills   olopatadine (PATANOL) 0.1 % ophthalmic solution [Pharmacy Med Name: OLOPATADINE 0.1% OPTH SOLN 5ML] 15 mL 0    Sig: INSTILL 1 DROP IN BOTH EYES TWICE DAILY     Ophthalmology:  Antiallergy Passed - 06/13/2022  3:26 AM      Passed - Valid encounter within last 12 months    Recent Outpatient Visits           3 months ago Annual physical exam   [place] [facility] at [facility], Jesse Sans, MD   1 year ago Annual physical exam   [facility] at [place], Jesse Sans, MD   1 year ago Essential (primary) hypertension   [place] [facility] at [facility], Jesse Sans, MD   2 years ago Essential (primary) hypertension   [place] [facility] at [facility], Jesse Sans, MD   2 years ago Annual physical exam   [place] at [facility], Jesse Sans, MD       Future Appointments             In 2 months [facility] Melia, Jesse Sans, MD [place] at [facility], [facility]   In 8 months Army Melia, Jesse Sans, MD [place] at [facility], [facility]

## 2022-08-20 DIAGNOSIS — H353232 Exudative age-related macular degeneration, bilateral, with inactive choroidal neovascularization: Secondary | ICD-10-CM | POA: Diagnosis not present

## 2022-08-20 DIAGNOSIS — H43813 Vitreous degeneration, bilateral: Secondary | ICD-10-CM | POA: Diagnosis not present

## 2022-08-28 ENCOUNTER — Ambulatory Visit: Payer: Medicare PPO | Admitting: Internal Medicine

## 2022-08-28 ENCOUNTER — Encounter: Payer: Self-pay | Admitting: Internal Medicine

## 2022-08-28 VITALS — BP 128/72 | HR 88 | Ht 62.0 in | Wt 179.0 lb

## 2022-08-28 DIAGNOSIS — I10 Essential (primary) hypertension: Secondary | ICD-10-CM | POA: Diagnosis not present

## 2022-08-28 NOTE — Assessment & Plan Note (Signed)
Clinically stable exam with well controlled BP on spironolactone. Tolerating medications without side effects. Pt to continue current regimen and low sodium diet.

## 2022-08-28 NOTE — Progress Notes (Signed)
Date:  08/28/2022   Name:  Barbara West   DOB:  [birth year]   MRN:  [id number]   Chief Complaint: Hypertension  Hypertension This is a chronic problem. The problem is controlled (at home 120's). Pertinent negatives include no blurred vision, chest pain, headaches, peripheral edema or shortness of breath. Past treatments include diuretics. The current treatment provides significant improvement. There is no history of kidney disease, CAD/MI or CVA.    Lab Results  Component Value Date   NA 136 02/26/2022   K 4.2 02/26/2022   CO2 24 02/26/2022   GLUCOSE 96 02/26/2022   BUN 14 02/26/2022   CREATININE 0.81 02/26/2022   CALCIUM 9.7 02/26/2022   EGFR 75 02/26/2022   GFRNONAA 74 02/17/2020   Lab Results  Component Value Date   CHOL 297 (H) 02/26/2022   HDL 73 02/26/2022   LDLCALC 184 (H) 02/26/2022   TRIG 213 (H) 02/26/2022   CHOLHDL 4.1 02/26/2022   Lab Results  Component Value Date   TSH 2.250 02/26/2022   No results found for: "HGBA1C" Lab Results  Component Value Date   WBC 11.9 (H) 02/26/2022   HGB 13.9 02/26/2022   HCT 40.8 02/26/2022   MCV 87 02/26/2022   PLT 415 02/26/2022   Lab Results  Component Value Date   ALT 8 02/26/2022   AST 18 02/26/2022   ALKPHOS 117 02/26/2022   BILITOT 0.2 02/26/2022   Lab Results  Component Value Date   VD25OH 39.5 02/17/2020     Review of Systems  Constitutional:  Negative for diaphoresis and fatigue.  HENT:  Negative for trouble swallowing.   Eyes:  Negative for blurred vision.  Respiratory:  Negative for chest tightness, shortness of breath and wheezing.   Cardiovascular:  Negative for chest pain.  Gastrointestinal:  Negative for abdominal pain.  Musculoskeletal:  Positive for arthralgias, back pain and gait problem.  Neurological:  Negative for dizziness, light-headedness and headaches.  Psychiatric/Behavioral:  Negative for dysphoric mood and sleep disturbance. The patient is not nervous/anxious.     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   Disability of walking 12/15/2016   Trochanteric bursitis of both hips 09/05/2016   Atrophic vaginitis 04/23/2015   Avitaminosis D 01/22/2015   Allergic rhinitis, seasonal 01/22/2015   Mixed hyperlipidemia 01/22/2015   Junctional nevus of multiple sites 01/22/2015   Essential (primary) hypertension 01/22/2015   Gastroesophageal reflux disease with esophagitis 01/22/2015    Allergies  Allergen Reactions   Celecoxib Anaphylaxis   Atorvastatin    Clarithromycin Other (See Comments)   Codeine Other (See Comments), Nausea Only and Nausea And Vomiting   Losartan Potassium Other (See Comments)    Pt does not believe this is correct but will not remove since it was more than 5 years ago   Morphine And Related Other (See Comments), Nausea Only and Nausea And Vomiting   Shellfish Allergy Nausea Only   Sulfa Antibiotics Nausea And Vomiting   Lisinopril Rash    Red skin   Mupirocin Rash    Cream/ointment.   Penicillins Rash    Past Surgical History:  Procedure Laterality Date   CATARACT EXTRACTION Bilateral    ESOPHAGOGASTRODUODENOSCOPY  2010   [facility]   EYE SURGERY  cateract   HIP SURGERY Right 06/2019   JOINT REPLACEMENT  2009   bilateral   REPLACEMENT TOTAL KNEE BILATERAL Bilateral     Social History   Tobacco Use   Smoking status: Never   Smokeless tobacco: Never   Tobacco comments:    Never used  Vaping Use   Vaping Use: Never used  Substance Use Topics   Alcohol use: Yes    Alcohol/week: 1.0 standard drink of alcohol    Types: 1 Glasses of wine per week    Comment: rarely   Drug use: No     Medication list has been reviewed and updated.  Current Meds  Medication Sig    albuterol (VENTOLIN HFA) 108 (90 Base) MCG/ACT inhaler Inhale 2 puffs into the lungs every 6 (six) hours as needed for wheezing or shortness of breath.   aspirin EC 81 MG tablet Take 81 mg by mouth daily.   azelastine (ASTELIN) 0.1 % nasal spray Place 1 spray into both nostrils 2 (two) times daily.   cholecalciferol (VITAMIN D) 400 units TABS tablet Take 400 Units by mouth daily.   conjugated estrogens (PREMARIN) vaginal cream Place 1 Applicatorful vaginally 3 (three) times a week.   fexofenadine (ALLEGRA) 180 MG tablet Take 180 mg by mouth daily.    fluocinonide cream (LIDEX) 0.05 % Apply 1 application topically as needed.   fluticasone (FLONASE) 50 MCG/ACT nasal spray Place 2 sprays into both nostrils 2 (two) times daily.   Ibuprofen 200 MG CAPS Take 1 capsule by mouth as needed.    lidocaine (LIDODERM) 5 % Place 1 patch onto the skin daily as needed.   meclizine (ANTIVERT) 25 MG tablet Take 25 mg by mouth 3 (three) times daily as needed for dizziness.   multivitamin-lutein (OCUVITE-LUTEIN) CAPS capsule Take 1 capsule by mouth daily.   olopatadine (PATANOL) 0.1 % ophthalmic solution INSTILL 1 DROP IN BOTH EYES TWICE DAILY   omeprazole (PRILOSEC) 40 MG capsule Take 1 capsule (40 mg total) by mouth daily.   pravastatin (PRAVACHOL) 10 MG tablet Take 1 tablet (10 mg total) by mouth daily.   spironolactone (ALDACTONE) 25 MG tablet Take 1 tablet (25 mg total) by mouth daily.       02/26/2022   10:05 AM 02/20/2021   10:05 AM 10/24/2020    1:39 PM 05/07/2020    1:42 PM  GAD 7 : Generalized Anxiety Score  Nervous, Anxious, on Edge 0 0 0 0  Control/stop worrying 0 0 0 0  Worry too much - different things 0 0 0 0  Trouble relaxing 0 0 0 0  Restless 0 0 0 0  Easily annoyed or irritable 0 0 0 0  Afraid - awful might happen 0 0 0 0  Total GAD 7 Score 0 0 0 0  Anxiety Difficulty Not difficult at all Not difficult at all Not difficult at all        05/23/2022    1:57 PM 05/23/2022    1:56 PM  02/26/2022   10:05 AM  Depression screen PHQ 2/9  Decreased Interest 0 0 0  Down, Depressed, Hopeless 0 0 0  PHQ - 2 Score 0 0 0  Altered sleeping   0  Tired, decreased energy   0  Change in appetite   0  Feeling bad or failure about yourself    0  Trouble concentrating   0  Moving slowly or fidgety/restless   0  Suicidal thoughts   0  PHQ-9 Score  0  Difficult doing work/chores   Not difficult at all    BP Readings from Last 3 Encounters:  08/28/22 128/72  02/26/22 (!) 150/88  02/20/21 122/80    Physical Exam Vitals and nursing note reviewed.  Constitutional:      General: She is not in acute distress.    Appearance: Normal appearance. She is well-developed.  HENT:     Head: Normocephalic and atraumatic.  Neck:     Vascular: No carotid bruit.  Cardiovascular:     Rate and Rhythm: Normal rate and regular rhythm.     Heart sounds: No murmur heard. Pulmonary:     Effort: Pulmonary effort is normal. No respiratory distress.     Breath sounds: No wheezing or rhonchi.  Musculoskeletal:     Cervical back: Normal range of motion.     Right lower leg: No edema.     Left lower leg: No edema.  Lymphadenopathy:     Cervical: No cervical adenopathy.  Skin:    General: Skin is warm and dry.     Capillary Refill: Capillary refill takes less than 2 seconds.     Findings: No rash.  Neurological:     General: No focal deficit present.     Mental Status: She is alert and oriented to person, place, and time.     Gait: Gait abnormal (uses rolator).  Psychiatric:        Mood and Affect: Mood normal.        Behavior: Behavior normal.     Wt Readings from Last 3 Encounters:  08/28/22 179 lb (81.2 kg)  02/26/22 175 lb (79.4 kg)  02/20/21 182 lb 3.2 oz (82.6 kg)    BP 128/72   Pulse 88   Ht  (1.575 m)   Wt 179 lb (81.2 kg)   SpO2 97%   BMI 32.74 kg/m   Assessment and Plan:  Problem List Items Addressed This Visit       Cardiovascular and Mediastinum    Essential (primary) hypertension - Primary (Chronic)    Clinically stable exam with well controlled BP on spironolactone. Tolerating medications without side effects. Pt to continue current regimen and low sodium diet.        No follow-ups on file.   Partially dictated using Dragon software, any errors are not intentional.  Reubin Milan, MD [facility] [place], [place]

## 2022-09-24 DIAGNOSIS — Z1211 Encounter for screening for malignant neoplasm of colon: Secondary | ICD-10-CM | POA: Diagnosis not present

## 2022-09-24 DIAGNOSIS — K5140 Inflammatory polyps of colon without complications: Secondary | ICD-10-CM | POA: Diagnosis not present

## 2022-09-24 DIAGNOSIS — D125 Benign neoplasm of sigmoid colon: Secondary | ICD-10-CM | POA: Diagnosis not present

## 2022-09-24 DIAGNOSIS — Z86010 Personal history of colonic polyps: Secondary | ICD-10-CM | POA: Diagnosis not present

## 2022-09-24 DIAGNOSIS — Z79899 Other long term (current) drug therapy: Secondary | ICD-10-CM | POA: Diagnosis not present

## 2022-09-24 DIAGNOSIS — K644 Residual hemorrhoidal skin tags: Secondary | ICD-10-CM | POA: Diagnosis not present

## 2022-09-24 DIAGNOSIS — K648 Other hemorrhoids: Secondary | ICD-10-CM | POA: Diagnosis not present

## 2022-09-24 DIAGNOSIS — D126 Benign neoplasm of colon, unspecified: Secondary | ICD-10-CM | POA: Diagnosis not present

## 2022-09-24 DIAGNOSIS — I10 Essential (primary) hypertension: Secondary | ICD-10-CM | POA: Diagnosis not present

## 2022-09-24 DIAGNOSIS — K219 Gastro-esophageal reflux disease without esophagitis: Secondary | ICD-10-CM | POA: Diagnosis not present

## 2022-09-24 DIAGNOSIS — K5730 Diverticulosis of large intestine without perforation or abscess without bleeding: Secondary | ICD-10-CM | POA: Diagnosis not present

## 2022-09-24 DIAGNOSIS — D122 Benign neoplasm of ascending colon: Secondary | ICD-10-CM | POA: Diagnosis not present

## 2022-10-10 DIAGNOSIS — D225 Melanocytic nevi of trunk: Secondary | ICD-10-CM | POA: Diagnosis not present

## 2022-10-10 DIAGNOSIS — E782 Mixed hyperlipidemia: Secondary | ICD-10-CM | POA: Diagnosis not present

## 2022-10-10 DIAGNOSIS — L821 Other seborrheic keratosis: Secondary | ICD-10-CM | POA: Diagnosis not present

## 2022-10-10 DIAGNOSIS — Z1283 Encounter for screening for malignant neoplasm of skin: Secondary | ICD-10-CM | POA: Diagnosis not present

## 2022-10-10 DIAGNOSIS — D485 Neoplasm of uncertain behavior of skin: Secondary | ICD-10-CM | POA: Diagnosis not present

## 2022-10-10 DIAGNOSIS — L570 Actinic keratosis: Secondary | ICD-10-CM | POA: Diagnosis not present

## 2022-10-10 DIAGNOSIS — L820 Inflamed seborrheic keratosis: Secondary | ICD-10-CM | POA: Diagnosis not present

## 2022-10-10 DIAGNOSIS — Z85828 Personal history of other malignant neoplasm of skin: Secondary | ICD-10-CM | POA: Diagnosis not present

## 2022-10-10 DIAGNOSIS — L812 Freckles: Secondary | ICD-10-CM | POA: Diagnosis not present

## 2022-11-07 DIAGNOSIS — M533 Sacrococcygeal disorders, not elsewhere classified: Secondary | ICD-10-CM | POA: Diagnosis not present

## 2022-11-12 DIAGNOSIS — H353232 Exudative age-related macular degeneration, bilateral, with inactive choroidal neovascularization: Secondary | ICD-10-CM | POA: Diagnosis not present

## 2022-11-12 DIAGNOSIS — H43813 Vitreous degeneration, bilateral: Secondary | ICD-10-CM | POA: Diagnosis not present

## 2022-11-12 LAB — HM DIABETES EYE EXAM

## 2022-11-13 ENCOUNTER — Encounter: Payer: Self-pay | Admitting: Internal Medicine

## 2022-11-17 DIAGNOSIS — H353231 Exudative age-related macular degeneration, bilateral, with active choroidal neovascularization: Secondary | ICD-10-CM | POA: Diagnosis not present

## 2022-11-19 DIAGNOSIS — M533 Sacrococcygeal disorders, not elsewhere classified: Secondary | ICD-10-CM | POA: Diagnosis not present

## 2022-12-17 DIAGNOSIS — H353211 Exudative age-related macular degeneration, right eye, with active choroidal neovascularization: Secondary | ICD-10-CM | POA: Diagnosis not present

## 2022-12-17 DIAGNOSIS — H353222 Exudative age-related macular degeneration, left eye, with inactive choroidal neovascularization: Secondary | ICD-10-CM | POA: Diagnosis not present

## 2023-01-02 DIAGNOSIS — M533 Sacrococcygeal disorders, not elsewhere classified: Secondary | ICD-10-CM | POA: Diagnosis not present

## 2023-01-19 ENCOUNTER — Encounter: Payer: Self-pay | Admitting: Internal Medicine

## 2023-01-21 DIAGNOSIS — H353211 Exudative age-related macular degeneration, right eye, with active choroidal neovascularization: Secondary | ICD-10-CM | POA: Diagnosis not present

## 2023-01-21 DIAGNOSIS — H353222 Exudative age-related macular degeneration, left eye, with inactive choroidal neovascularization: Secondary | ICD-10-CM | POA: Diagnosis not present

## 2023-03-04 ENCOUNTER — Encounter: Payer: Self-pay | Admitting: Internal Medicine

## 2023-03-04 ENCOUNTER — Ambulatory Visit (INDEPENDENT_AMBULATORY_CARE_PROVIDER_SITE_OTHER): Payer: Medicare PPO | Admitting: Internal Medicine

## 2023-03-04 VITALS — BP 126/76 | HR 100 | Ht 62.0 in | Wt 186.0 lb

## 2023-03-04 DIAGNOSIS — I10 Essential (primary) hypertension: Secondary | ICD-10-CM | POA: Diagnosis not present

## 2023-03-04 DIAGNOSIS — Z0000 Encounter for general adult medical examination without abnormal findings: Secondary | ICD-10-CM | POA: Diagnosis not present

## 2023-03-04 DIAGNOSIS — Z23 Encounter for immunization: Secondary | ICD-10-CM | POA: Diagnosis not present

## 2023-03-04 DIAGNOSIS — K224 Dyskinesia of esophagus: Secondary | ICD-10-CM

## 2023-03-04 DIAGNOSIS — E782 Mixed hyperlipidemia: Secondary | ICD-10-CM

## 2023-03-04 DIAGNOSIS — Z1231 Encounter for screening mammogram for malignant neoplasm of breast: Secondary | ICD-10-CM | POA: Diagnosis not present

## 2023-03-04 DIAGNOSIS — K2100 Gastro-esophageal reflux disease with esophagitis, without bleeding: Secondary | ICD-10-CM

## 2023-03-04 MED ORDER — AZELASTINE HCL 0.1 % NA SOLN: 1.0000 | Freq: Two times a day (BID) | NASAL | 3 refills | Status: DC

## 2023-03-04 MED ORDER — LANSOPRAZOLE 30 MG PO CPDR: 30.0000 mg | DELAYED_RELEASE_CAPSULE | Freq: Every day | ORAL | 1 refills | Status: DC

## 2023-03-04 MED ORDER — FLUTICASONE PROPIONATE 50 MCG/ACT NA SUSP: 2.0000 | Freq: Two times a day (BID) | NASAL | 3 refills | Status: DC

## 2023-03-04 MED ORDER — HYDROCHLOROTHIAZIDE 25 MG PO TABS: 25.0000 mg | ORAL_TABLET | Freq: Every day | ORAL | 1 refills | Status: DC

## 2023-03-04 NOTE — Assessment & Plan Note (Addendum)
LDL is  Lab Results  Component Value Date   LDLCALC 184 (H) 02/26/2022  Currently being treated with pravastatin with good compliance and no concerns.  Medications ordered last year but not filled.

## 2023-03-04 NOTE — Assessment & Plan Note (Signed)
Reflux symptoms are minimal on current therapy - omeprazole. No red flag signs such as weight loss, n/v, melena  

## 2023-03-04 NOTE — Patient Instructions (Signed)
Call [facility] to schedule your Mammogram at [phone].

## 2023-03-04 NOTE — Assessment & Plan Note (Addendum)
Normal exam with stable BP on hydrochlorothiazide.  Could not take Aldactone due to over-diuresis. No concerns or side effects to current medication. No change in regimen; continue low sodium diet.

## 2023-03-04 NOTE — Progress Notes (Signed)
Date:  03/04/2023   Name:  Barbara West   DOB:  1946-04-11   MRN:  [id number]   Chief Complaint: Annual Exam Barbara West is a 77 y.o. female who presents today for her Complete Annual Exam. She feels well. She reports exercising- none. She reports she is sleeping well. Breast complaints - none.  Mammogram: 02/2022 [facility] DEXA: 06/2014 [facility] Colonoscopy: 09/2022 no repeat for screening  Health Maintenance Due  Topic Date Due   Zoster Vaccines- Shingrix (1 of 2) Never done   COVID-19 Vaccine (3 - 2023-24 season) 01/04/2023   MAMMOGRAM  02/19/2023    Immunization History  Administered Date(s) Administered   Fluad Quad(high Dose 65+) 02/16/2019, 02/17/2020, 02/20/2021, 02/26/2022   Fluad Trivalent(High Dose 65+) 03/04/2023   Influenza, High Dose Seasonal PF 02/13/2017, 01/11/2018   Influenza,inj,quad, With Preservative 02/13/2017   Influenza-Unspecified 03/06/2015, 02/06/2016, 02/20/2017   PFIZER Comirnaty(Gray Top)Covid-19 Tri-Sucrose Vaccine 06/17/2019, 07/08/2019   PNEUMOCOCCAL CONJUGATE-20 03/04/2023   Pneumococcal Conjugate-13 02/20/2014   Pneumococcal Polysaccharide-23 04/23/2015     Hypertension This is a chronic problem. The problem is controlled. Pertinent negatives include no chest pain, headaches, palpitations or shortness of breath. Past treatments include diuretics. The current treatment provides significant improvement.  Hyperlipidemia This is a chronic problem. The problem is uncontrolled. Pertinent negatives include no chest pain or shortness of breath. Current antihyperlipidemic treatment includes statins (statin started last visit).  Gastroesophageal Reflux She complains of abdominal pain, dysphagia and heartburn. She reports no chest pain, no coughing or no wheezing. This is a recurrent problem. Pertinent negatives include no fatigue. She has tried a PPI (omeprazole but prevacid works better) for the symptoms. The treatment provided moderate relief. Past  procedures include an EGD (none since 2018).    Review of Systems  Constitutional:  Negative for chills, fatigue and fever.  HENT:  Positive for trouble swallowing. Negative for congestion, hearing loss, tinnitus and voice change.   Eyes:  Negative for visual disturbance.  Respiratory:  Negative for cough, chest tightness, shortness of breath and wheezing.   Cardiovascular:  Negative for chest pain, palpitations and leg swelling.  Gastrointestinal:  Positive for abdominal pain, dysphagia and heartburn. Negative for constipation, diarrhea and vomiting.  Endocrine: Negative for polydipsia and polyuria.  Genitourinary:  Negative for dysuria, frequency, genital sores, vaginal bleeding and vaginal discharge.  Musculoskeletal:  Positive for arthralgias and gait problem. Negative for joint swelling.  Skin:  Negative for color change and rash.  Neurological:  Positive for dizziness. Negative for tremors, light-headedness and headaches.  Hematological:  Negative for adenopathy. Does not bruise/bleed easily.  Psychiatric/Behavioral:  Negative for dysphoric mood and sleep disturbance. The patient is not nervous/anxious.      Lab Results  Component Value Date   NA 136 02/26/2022   K 4.2 02/26/2022   CO2 24 02/26/2022   GLUCOSE 96 02/26/2022   BUN 14 02/26/2022   CREATININE 0.81 02/26/2022   CALCIUM 9.7 02/26/2022   EGFR 75 02/26/2022   GFRNONAA 74 02/17/2020   Lab Results  Component Value Date   CHOL 297 (H) 02/26/2022   HDL 73 02/26/2022   LDLCALC 184 (H) 02/26/2022   TRIG 213 (H) 02/26/2022   CHOLHDL 4.1 02/26/2022   Lab Results  Component Value Date   TSH 2.250 02/26/2022   No results found for: "HGBA1C" Lab Results  Component Value Date   WBC 11.9 (H) 02/26/2022   HGB 13.9 02/26/2022   HCT 40.8 02/26/2022   MCV 87  02/26/2022   PLT 415 02/26/2022   Lab Results  Component Value Date   ALT 8 02/26/2022   AST 18 02/26/2022   ALKPHOS 117 02/26/2022   BILITOT 0.2 02/26/2022    Lab Results  Component Value Date   VD25OH 39.5 02/17/2020     Patient Active Problem List   Diagnosis Date Noted   Claustrophobia 05/05/2021   History of total knee arthroplasty 03/15/2021   Degeneration of lumbar intervertebral disc 03/15/2021   Pain in joint of left shoulder 08/10/2020   Lumbar spondylosis 12/22/2018   Esophageal dysmotility 12/19/2016   Disability of walking 12/15/2016   Trochanteric bursitis of both hips 09/05/2016   Atrophic vaginitis 04/23/2015   Avitaminosis D 01/22/2015   Allergic rhinitis, seasonal 01/22/2015   Mixed hyperlipidemia 01/22/2015   Junctional nevus of multiple sites 01/22/2015   Essential (primary) hypertension 01/22/2015   Gastroesophageal reflux disease with esophagitis 01/22/2015    Allergies  Allergen Reactions   Celecoxib Anaphylaxis   Atorvastatin    Clarithromycin Other (See Comments)   Codeine Other (See Comments), Nausea Only and Nausea And Vomiting   Losartan Potassium Other (See Comments)    Pt does not believe this is correct but will not remove since it was more than 5 years ago   Morphine And Codeine Other (See Comments), Nausea Only and Nausea And Vomiting   Shellfish Allergy Nausea Only   Sulfa Antibiotics Nausea And Vomiting   Lisinopril Rash    Red skin   Mupirocin Rash    Cream/ointment.   Penicillins Rash    Past Surgical History:  Procedure Laterality Date   CATARACT EXTRACTION Bilateral    ESOPHAGOGASTRODUODENOSCOPY  2010   [facility]el   EYE SURGERY  cateract   HIP SURGERY Right 06/2019   JOINT REPLACEMENT  2009   bilateral   REPLACEMENT TOTAL KNEE BILATERAL Bilateral     Social History   Tobacco Use   Smoking status: Never   Smokeless tobacco: Never   Tobacco comments:    Never used  Vaping Use   Vaping status: Never Used  Substance Use Topics   Alcohol use: Yes    Alcohol/week: 1.0 standard drink of alcohol    Types: 1 Glasses of wine per week    Comment: rarely   Drug use: No      Medication list has been reviewed and updated.  Current Meds  Medication Sig   albuterol (VENTOLIN HFA) 108 (90 Base) MCG/ACT inhaler Inhale 2 puffs into the lungs every 6 (six) hours as needed for wheezing or shortness of breath.   aspirin EC 81 MG tablet Take 81 mg by mouth daily.   cholecalciferol (VITAMIN D) 400 units TABS tablet Take 400 Units by mouth daily.   conjugated estrogens (PREMARIN) vaginal cream Place 1 Applicatorful vaginally 3 (three) times a week.   fexofenadine (ALLEGRA) 180 MG tablet Take 180 mg by mouth daily.    fluocinonide cream (LIDEX) 0.05 % Apply 1 application topically as needed.   Ibuprofen 200 MG CAPS Take 1 capsule by mouth as needed.    lidocaine (LIDODERM) 5 % Place 1 patch onto the skin daily as needed.   meclizine (ANTIVERT) 25 MG tablet Take 25 mg by mouth 3 (three) times daily as needed for dizziness.   multivitamin-lutein (OCUVITE-LUTEIN) CAPS capsule Take 1 capsule by mouth daily.   olopatadine (PATANOL) 0.1 % ophthalmic solution INSTILL 1 DROP IN BOTH EYES TWICE DAILY   pravastatin (PRAVACHOL) 10 MG tablet Take 1 tablet (10  mg total) by mouth daily.   [DISCONTINUED] azelastine (ASTELIN) 0.1 % nasal spray Place 1 spray into both nostrils 2 (two) times daily.   [DISCONTINUED] fluticasone (FLONASE) 50 MCG/ACT nasal spray Place 2 sprays into both nostrils 2 (two) times daily.   [DISCONTINUED] hydrochlorothiazide (HYDRODIURIL) 25 MG tablet Take 25 mg by mouth daily.   [DISCONTINUED] omeprazole (PRILOSEC) 40 MG capsule Take 1 capsule (40 mg total) by mouth daily.   [DISCONTINUED] spironolactone (ALDACTONE) 25 MG tablet Take 1 tablet (25 mg total) by mouth daily.       03/04/2023    9:45 AM 02/26/2022   10:05 AM 02/20/2021   10:05 AM 10/24/2020    1:39 PM  GAD 7 : Generalized Anxiety Score  Nervous, Anxious, on Edge 0 0 0 0  Control/stop worrying 0 0 0 0  Worry too much - different things 0 0 0 0  Trouble relaxing 0 0 0 0  Restless 0 0 0 0   Easily annoyed or irritable 0 0 0 0  Afraid - awful might happen 0 0 0 0  Total GAD 7 Score 0 0 0 0  Anxiety Difficulty Not difficult at all Not difficult at all Not difficult at all Not difficult at all       03/04/2023    9:45 AM 05/23/2022    1:57 PM 05/23/2022    1:56 PM  Depression screen PHQ 2/9  Decreased Interest 0 0 0  Down, Depressed, Hopeless 0 0 0  PHQ - 2 Score 0 0 0  Altered sleeping 0    Tired, decreased energy 0    Change in appetite 0    Feeling bad or failure about yourself  0    Trouble concentrating 0    Moving slowly or fidgety/restless 0    Suicidal thoughts 0    PHQ-9 Score 0    Difficult doing work/chores Not difficult at all      BP Readings from Last 3 Encounters:  03/04/23 126/76  08/28/22 128/72  02/26/22 (!) 150/88    Physical Exam Vitals and nursing note reviewed.  Constitutional:      General: She is not in acute distress.    Appearance: She is well-developed.  HENT:     Head: Normocephalic and atraumatic.     Right Ear: Tympanic membrane and ear canal normal.     Left Ear: Tympanic membrane and ear canal normal.     Nose:     Right Sinus: No maxillary sinus tenderness.     Left Sinus: No maxillary sinus tenderness.  Eyes:     General: No scleral icterus.       Right eye: No discharge.        Left eye: No discharge.     Conjunctiva/sclera: Conjunctivae normal.  Neck:     Thyroid: No thyromegaly.     Vascular: No carotid bruit.  Cardiovascular:     Rate and Rhythm: Normal rate and regular rhythm.     Pulses: Normal pulses.     Heart sounds: Normal heart sounds.  Pulmonary:     Effort: Pulmonary effort is normal. No respiratory distress.     Breath sounds: No wheezing.  Abdominal:     General: Bowel sounds are normal.     Palpations: Abdomen is soft.     Tenderness: There is no abdominal tenderness.  Musculoskeletal:     Cervical back: Normal range of motion. No erythema.     Right lower leg: No edema.  Left lower leg:  No edema.  Lymphadenopathy:     Cervical: No cervical adenopathy.  Skin:    General: Skin is warm and dry.     Capillary Refill: Capillary refill takes less than 2 seconds.     Findings: No rash.  Neurological:     Mental Status: She is alert and oriented to person, place, and time.     Cranial Nerves: No cranial nerve deficit.     Sensory: No sensory deficit.     Gait: Gait abnormal (uses walker).     Deep Tendon Reflexes: Reflexes are normal and symmetric.  Psychiatric:        Attention and Perception: Attention normal.        Mood and Affect: Mood normal.     Wt Readings from Last 3 Encounters:  03/04/23 186 lb (84.4 kg)  08/28/22 179 lb (81.2 kg)  02/26/22 175 lb (79.4 kg)    BP 126/76 (BP Location: Left Arm, Cuff Size: Large)   Pulse 100   Ht 5\' 2"  (1.575 m)   Wt 186 lb (84.4 kg)   SpO2 97%   BMI 34.02 kg/m   Assessment and Plan:  Problem List Items Addressed This Visit       Unprioritized   Esophageal dysmotility    Recurrent esophageal symptoms recently.  No obstruction but burning pain with certain foods and at other times as well. On omeprazole - will change to Prevacid Last EGD 2018 Refer to GI      Relevant Medications   lansoprazole (PREVACID) 30 MG capsule   Other Relevant Orders   Ambulatory referral to Gastroenterology   Essential (primary) hypertension (Chronic)    Normal exam with stable BP on hydrochlorothiazide.  Could not take Aldactone due to over-diuresis. No concerns or side effects to current medication. No change in regimen; continue low sodium diet.       Relevant Medications   hydrochlorothiazide (HYDRODIURIL) 25 MG tablet   Other Relevant Orders   Comprehensive metabolic panel   TSH   Urinalysis, Routine w reflex microscopic   Gastroesophageal reflux disease with esophagitis (Chronic)    Reflux symptoms are minimal on current therapy - omeprazole. No red flag signs such as weight loss, n/v, melena       Relevant  Medications   lansoprazole (PREVACID) 30 MG capsule   Other Relevant Orders   CBC with Differential/Platelet   Mixed hyperlipidemia (Chronic)    LDL is  Lab Results  Component Value Date   LDLCALC 184 (H) 02/26/2022  Currently being treated with pravastatin with good compliance and no concerns.  Medications ordered last year but not filled.       Relevant Medications   hydrochlorothiazide (HYDRODIURIL) 25 MG tablet   Other Relevant Orders   Lipid panel   Other Visit Diagnoses     Annual physical exam    -  Primary   Encounter for screening mammogram for breast cancer       pt to schedule at [facility]   Need for influenza vaccination       Relevant Orders   Flu Vaccine Trivalent High Dose (Fluad) (Completed)   Pneumococcal conjugate vaccine 20-valent (Prevnar 20) (Completed)       Return in about 6 months (around 09/02/2023) for HTN.    Reubin Milan, MD [facility] [place]

## 2023-03-04 NOTE — Assessment & Plan Note (Addendum)
Recurrent esophageal symptoms recently.  No obstruction but burning pain with certain foods and at other times as well. On omeprazole - will change to Prevacid Last EGD 2018 Refer to GI

## 2023-03-05 ENCOUNTER — Other Ambulatory Visit: Payer: Self-pay | Admitting: Internal Medicine

## 2023-03-05 ENCOUNTER — Encounter: Payer: Self-pay | Admitting: Internal Medicine

## 2023-03-05 DIAGNOSIS — D72829 Elevated white blood cell count, unspecified: Secondary | ICD-10-CM | POA: Insufficient documentation

## 2023-03-05 DIAGNOSIS — D72828 Other elevated white blood cell count: Secondary | ICD-10-CM

## 2023-03-05 LAB — CBC WITH DIFFERENTIAL/PLATELET
Basophils Absolute: 0.1 10*3/uL (ref 0.0–0.2)
Basos: 1 %
EOS (ABSOLUTE): 0.7 10*3/uL — ABNORMAL HIGH (ref 0.0–0.4)
Eos: 6 %
Hematocrit: 40.9 % (ref 34.0–46.6)
Hemoglobin: 13.5 g/dL (ref 11.1–15.9)
Immature Grans (Abs): 0.1 10*3/uL (ref 0.0–0.1)
Immature Granulocytes: 1 %
Lymphocytes Absolute: 3.7 10*3/uL — ABNORMAL HIGH (ref 0.7–3.1)
Lymphs: 33 %
MCH: 29 pg (ref 26.6–33.0)
MCHC: 33 g/dL (ref 31.5–35.7)
MCV: 88 fL (ref 79–97)
Monocytes Absolute: 1.4 10*3/uL — ABNORMAL HIGH (ref 0.1–0.9)
Monocytes: 13 %
Neutrophils Absolute: 5.3 10*3/uL (ref 1.4–7.0)
Neutrophils: 46 %
Platelets: 457 10*3/uL — ABNORMAL HIGH (ref 150–450)
RBC: 4.66 x10E6/uL (ref 3.77–5.28)
RDW: 12 % (ref 11.7–15.4)
WBC: 11.2 10*3/uL — ABNORMAL HIGH (ref 3.4–10.8)

## 2023-03-05 LAB — COMPREHENSIVE METABOLIC PANEL
ALT: 11 [IU]/L (ref 0–32)
AST: 18 [IU]/L (ref 0–40)
Albumin: 4 g/dL (ref 3.8–4.8)
Alkaline Phosphatase: 114 [IU]/L (ref 44–121)
BUN/Creatinine Ratio: 12 (ref 12–28)
BUN: 11 mg/dL (ref 8–27)
Bilirubin Total: 0.2 mg/dL (ref 0.0–1.2)
CO2: 21 mmol/L (ref 20–29)
Calcium: 9.9 mg/dL (ref 8.7–10.3)
Chloride: 98 mmol/L (ref 96–106)
Creatinine, Ser: 0.9 mg/dL (ref 0.57–1.00)
Globulin, Total: 2.9 g/dL (ref 1.5–4.5)
Glucose: 85 mg/dL (ref 70–99)
Potassium: 4 mmol/L (ref 3.5–5.2)
Sodium: 136 mmol/L (ref 134–144)
Total Protein: 6.9 g/dL (ref 6.0–8.5)
eGFR: 66 mL/min/{1.73_m2} (ref >59)

## 2023-03-05 LAB — URINALYSIS, ROUTINE W REFLEX MICROSCOPIC
Bilirubin, UA: NEGATIVE
Glucose, UA: NEGATIVE
Ketones, UA: NEGATIVE
Leukocytes,UA: NEGATIVE
Nitrite, UA: NEGATIVE
Protein,UA: NEGATIVE
RBC, UA: NEGATIVE
Specific Gravity, UA: 1.014 (ref 1.005–1.030)
Urobilinogen, Ur: 0.2 mg/dL (ref 0.2–1.0)
pH, UA: 7.5 (ref 5.0–7.5)

## 2023-03-05 LAB — LIPID PANEL
Chol/HDL Ratio: 4.1 ratio (ref 0.0–4.4)
Cholesterol, Total: 278 mg/dL — ABNORMAL HIGH (ref 100–199)
HDL: 67 mg/dL (ref >39)
LDL Chol Calc (NIH): 168 mg/dL — ABNORMAL HIGH (ref 0–99)
Triglycerides: 232 mg/dL — ABNORMAL HIGH (ref 0–149)
VLDL Cholesterol Cal: 43 mg/dL — ABNORMAL HIGH (ref 5–40)

## 2023-03-05 LAB — TSH: TSH: 2.34 u[IU]/mL (ref 0.450–4.500)

## 2023-03-05 NOTE — Telephone Encounter (Signed)
Please review.  KP

## 2023-03-05 NOTE — Telephone Encounter (Signed)
Pt response.  KP

## 2023-03-11 DIAGNOSIS — H353222 Exudative age-related macular degeneration, left eye, with inactive choroidal neovascularization: Secondary | ICD-10-CM | POA: Diagnosis not present

## 2023-03-11 DIAGNOSIS — H353211 Exudative age-related macular degeneration, right eye, with active choroidal neovascularization: Secondary | ICD-10-CM | POA: Diagnosis not present

## 2023-03-18 ENCOUNTER — Encounter: Payer: Self-pay | Admitting: Internal Medicine

## 2023-03-18 NOTE — Telephone Encounter (Signed)
FYI  KP

## 2023-03-19 ENCOUNTER — Other Ambulatory Visit: Payer: Self-pay | Admitting: Internal Medicine

## 2023-03-19 DIAGNOSIS — K2100 Gastro-esophageal reflux disease with esophagitis, without bleeding: Secondary | ICD-10-CM

## 2023-03-19 NOTE — Telephone Encounter (Signed)
Requested Prescriptions  Refused Prescriptions Disp Refills   omeprazole (PRILOSEC) 40 MG capsule [Pharmacy Med Name: OMEPRAZOLE 40MG  CAPSULES] 90 capsule 3    Sig: TAKE 1 CAPSULE(40 MG) BY MOUTH DAILY     Gastroenterology: Proton Pump Inhibitors Passed - 03/19/2023  3:25 AM      Passed - Valid encounter within last 12 months    Recent Outpatient Visits           2 weeks ago Annual physical exam   [place] [facility] at [facility], Nyoka Cowden, MD   6 months ago Essential (primary) hypertension   [place] [facility] at [facility], Nyoka Cowden, MD   1 year ago Annual physical exam   [facility] at [facility], Nyoka Cowden, MD   2 years ago Annual physical exam   [facility] at [facility], Nyoka Cowden, MD   2 years ago Essential (primary) hypertension   [place] [facility] at [facility], Nyoka Cowden, MD       Future Appointments             In 5 months Judithann Graves, Nyoka Cowden, MD [facility] at [facility], [facility]   In 11 months Judithann Graves, Nyoka Cowden, MD [facility] at [facility], [facility]

## 2023-04-10 DIAGNOSIS — Z1159 Encounter for screening for other viral diseases: Secondary | ICD-10-CM | POA: Diagnosis not present

## 2023-04-10 DIAGNOSIS — E876 Hypokalemia: Secondary | ICD-10-CM | POA: Diagnosis not present

## 2023-04-10 DIAGNOSIS — E119 Type 2 diabetes mellitus without complications: Secondary | ICD-10-CM | POA: Diagnosis not present

## 2023-04-10 DIAGNOSIS — Z79899 Other long term (current) drug therapy: Secondary | ICD-10-CM | POA: Diagnosis not present

## 2023-04-10 DIAGNOSIS — D72829 Elevated white blood cell count, unspecified: Secondary | ICD-10-CM | POA: Diagnosis not present

## 2023-04-10 DIAGNOSIS — F419 Anxiety disorder, unspecified: Secondary | ICD-10-CM | POA: Diagnosis not present

## 2023-04-10 DIAGNOSIS — D320 Benign neoplasm of cerebral meninges: Secondary | ICD-10-CM | POA: Insufficient documentation

## 2023-04-10 DIAGNOSIS — D72820 Lymphocytosis (symptomatic): Secondary | ICD-10-CM | POA: Diagnosis not present

## 2023-04-10 DIAGNOSIS — I10 Essential (primary) hypertension: Secondary | ICD-10-CM | POA: Diagnosis not present

## 2023-04-10 DIAGNOSIS — J329 Chronic sinusitis, unspecified: Secondary | ICD-10-CM | POA: Diagnosis not present

## 2023-05-12 ENCOUNTER — Other Ambulatory Visit: Payer: Self-pay | Admitting: Internal Medicine

## 2023-05-12 DIAGNOSIS — I10 Essential (primary) hypertension: Secondary | ICD-10-CM

## 2023-05-13 ENCOUNTER — Encounter: Payer: Self-pay | Admitting: Internal Medicine

## 2023-05-13 ENCOUNTER — Other Ambulatory Visit: Payer: Self-pay | Admitting: Internal Medicine

## 2023-05-13 DIAGNOSIS — I10 Essential (primary) hypertension: Secondary | ICD-10-CM

## 2023-05-13 DIAGNOSIS — D72820 Lymphocytosis (symptomatic): Secondary | ICD-10-CM | POA: Diagnosis not present

## 2023-05-13 MED ORDER — HYDROCHLOROTHIAZIDE 25 MG PO TABS: 25.0000 mg | ORAL_TABLET | Freq: Every day | ORAL | 1 refills | Status: DC

## 2023-05-13 NOTE — Telephone Encounter (Signed)
 Requested Prescriptions  Pending Prescriptions Disp Refills   fluticasone  (FLONASE ) 50 MCG/ACT nasal spray [Pharmacy Med Name: FLUTICASONE  NASAL SP (120) RX] 48 g 3    Sig: SHAKE LIQUID AND USE 2 SPRAYS IN EACH NOSTRIL TWICE DAILY     Ear, Nose, and Throat: Nasal Preparations - Corticosteroids Passed - 05/13/2023  2:02 PM      Passed - Valid encounter within last 12 months    Recent Outpatient Visits           2 months ago Annual physical exam   [place] [facility] at [facility], Leita DEL, MD   8 months ago Essential (primary) hypertension   [place] [facility] at [facility], Leita DEL, MD   1 year ago Annual physical exam   [place] [facility] at [facility], Leita DEL, MD   2 years ago Annual physical exam   [place] [facility] at [facility], Leita DEL, MD   2 years ago Essential (primary) hypertension   [place] [facility] at [facility], Leita DEL, MD       Future Appointments             In 3 months Justus Leita DEL, MD [facility] at [facility], [facility]   In 9 months Justus Leita DEL, MD [facility] Primary Care & Sports Medicine at [facility], [facility]            Refused Prescriptions Disp Refills   hydrochlorothiazide  (HYDRODIURIL ) 25 MG tablet [Pharmacy Med Name: HYDROCHLOROTHIAZIDE  25MG TABLETS] 90 tablet 1    Sig: TAKE 1 TABLET(25 MG) BY MOUTH DAILY     Cardiovascular: Diuretics - Thiazide Passed - 05/13/2023  2:02 PM      Passed - Cr in normal range and within 180 days    Creatinine, Ser  Date Value Ref Range Status  03/04/2023 0.90 0.57 - 1.00 mg/dL Final         Passed - K in normal range and within 180 days    Potassium  Date Value Ref Range Status  03/04/2023 4.0 3.5 - 5.2 mmol/L Final         Passed - Na in normal range and within 180  days    Sodium  Date Value Ref Range Status  03/04/2023 136 134 - 144 mmol/L Final         Passed - Last BP in normal range    BP Readings from Last 1 Encounters:  03/04/23 126/76         Passed - Valid encounter within last 6 months    Recent Outpatient Visits           2 months ago Annual physical exam   [place] [facility] at [facility], Leita DEL, MD   8 months ago Essential (primary) hypertension   [place] [facility] at [facility], Leita DEL, MD   1 year ago Annual physical exam   [facility] at [facility], Leita DEL, MD   2 years ago Annual physical exam   [facility] at [facility], Leita DEL, MD   2 years ago Essential (primary) hypertension    Primary Care & Sports Medicine at [facility]  Justus Leita DEL, MD       Future Appointments             In 3 months Justus Leita DEL, MD [facility] & [facility] Medicine at [facility], [facility]   In 9 months Justus, Leita DEL, MD [facility] Primary Care & Sports Medicine at [facility], [facility]

## 2023-05-21 ENCOUNTER — Encounter: Payer: Self-pay | Admitting: Emergency Medicine

## 2023-05-21 DIAGNOSIS — R1013 Epigastric pain: Secondary | ICD-10-CM | POA: Diagnosis not present

## 2023-05-21 DIAGNOSIS — R1319 Other dysphagia: Secondary | ICD-10-CM | POA: Diagnosis not present

## 2023-05-21 DIAGNOSIS — R12 Heartburn: Secondary | ICD-10-CM | POA: Diagnosis not present

## 2023-05-28 ENCOUNTER — Ambulatory Visit: Payer: Medicare PPO | Admitting: Emergency Medicine

## 2023-05-28 VITALS — Ht 62.0 in | Wt 170.0 lb

## 2023-05-28 DIAGNOSIS — Z780 Asymptomatic menopausal state: Secondary | ICD-10-CM

## 2023-05-28 DIAGNOSIS — Z1231 Encounter for screening mammogram for malignant neoplasm of breast: Secondary | ICD-10-CM

## 2023-05-28 DIAGNOSIS — Z0000 Encounter for general adult medical examination without abnormal findings: Secondary | ICD-10-CM | POA: Diagnosis not present

## 2023-05-28 NOTE — Progress Notes (Cosign Needed)
Subjective:   Barbara West is a 78 y.o. female who presents for Medicare Annual (Subsequent) preventive examination.  This patient declined Interactive audio and Acupuncturist. Therefore the visit was completed with audio only.   Visit Complete: Virtual I connected with  Barbara West on 05/28/23 by a audio enabled telemedicine application and verified that I am speaking with the correct person using two identifiers.  Patient Location: Home  Provider Location: Home Office  I discussed the limitations of evaluation and management by telemedicine. The patient expressed understanding and agreed to proceed.  Vital Signs: Because this visit was a virtual/telehealth visit, some criteria may be missing or patient reported. Any vitals not documented were not able to be obtained and vitals that have been documented are patient reported.  Patient Medicare AWV questionnaire was completed by the patient on 05/24/23; I have confirmed that all information answered by patient is correct and no changes since this date.  Cardiac Risk Factors include: advanced age (>70men, >59 women);hypertension;dyslipidemia;obesity (BMI >30kg/m2)     Objective:    Today's Vitals   05/28/23 1421  Weight: 170 lb (77.1 kg)  Height: 5\' 2"  (1.575 m)   Body mass index is 31.09 kg/m.     05/28/2023    2:33 PM 05/23/2022    2:53 PM 05/20/2021    1:43 PM 03/12/2020   10:21 AM 10/06/2018    1:40 PM 09/16/2017    2:03 PM 09/05/2016   10:40 AM  Advanced Directives  Does Patient Have a Medical Advance Directive? Yes Yes Yes Yes Yes Yes Yes  Type of Estate agent of [place];Living will Healthcare Power of [place];Living will Healthcare Power of [place];Living will Healthcare Power of [place];Living will Healthcare Power of [place];Living will Healthcare Power of [place];Living will Healthcare Power of [place];Living will  Does patient want to make changes to medical advance directive? No  - Patient declined No - Patient declined       Copy of Healthcare Power of Attorney in Chart? No - copy requested No - copy requested No - copy requested No - copy requested No - copy requested No - copy requested No - copy requested    Current Medications (verified) Outpatient Encounter Medications as of 05/28/2023  Medication Sig   albuterol (VENTOLIN HFA) 108 (90 Base) MCG/ACT inhaler Inhale 2 puffs into the lungs every 6 (six) hours as needed for wheezing or shortness of breath.   aspirin EC 81 MG tablet Take 81 mg by mouth daily.   azelastine (ASTELIN) 0.1 % nasal spray Place 1 spray into both nostrils 2 (two) times daily.   cholecalciferol (VITAMIN D) 400 units TABS tablet Take 400 Units by mouth daily.   conjugated estrogens (PREMARIN) vaginal cream Place 1 Applicatorful vaginally 3 (three) times a week.   fexofenadine (ALLEGRA) 180 MG tablet Take 180 mg by mouth daily.    fluocinonide cream (LIDEX) 0.05 % Apply 1 application topically as needed.   fluticasone (FLONASE) 50 MCG/ACT nasal spray SHAKE LIQUID AND USE 2 SPRAYS IN EACH NOSTRIL TWICE DAILY   gabapentin (NEURONTIN) 100 MG capsule Take 100 mg by mouth at bedtime as needed.   hydrochlorothiazide (HYDRODIURIL) 25 MG tablet Take 1 tablet (25 mg total) by mouth daily.   Ibuprofen 200 MG CAPS Take 1 capsule by mouth as needed.    lansoprazole (PREVACID) 30 MG capsule Take 1 capsule (30 mg total) by mouth daily at 12 noon.   lidocaine (LIDODERM) 5 % Place 1 patch onto  the skin daily as needed.   meclizine (ANTIVERT) 25 MG tablet Take 25 mg by mouth 3 (three) times daily as needed for dizziness.   meloxicam (MOBIC) 7.5 MG tablet Take 7.5 mg by mouth daily as needed.   multivitamin-lutein (OCUVITE-LUTEIN) CAPS capsule Take 1 capsule by mouth daily.   olopatadine (PATANOL) 0.1 % ophthalmic solution INSTILL 1 DROP IN BOTH EYES TWICE DAILY   pravastatin (PRAVACHOL) 10 MG tablet Take 1 tablet (10 mg total) by mouth daily.   No  facility-administered encounter medications on file as of 05/28/2023.    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 Burtis Junes - [facility]; on slow prednisone taper   Seasonal allergies    Trochanteric bursitis    Vertigo    Vitamin D deficiency    Past Surgical History:  Procedure Laterality Date   CATARACT EXTRACTION Bilateral    ESOPHAGOGASTRODUODENOSCOPY  2010   [place]   EYE SURGERY  cateract   HIP SURGERY Right 06/2019   JOINT REPLACEMENT  2009   bilateral   REPLACEMENT TOTAL KNEE BILATERAL Bilateral    Family History  Problem Relation Age of Onset   Alzheimer's disease Mother    Vision loss Father    Heart disease Brother    Social History   Socioeconomic History   Marital status: Married    Spouse name: Everlina Gertner   Number of children: 0   Years of education: 14   Highest education level: Bachelor's degree (e.g., BA, AB, BS)  Occupational History   Occupation: Retired  Tobacco Use   Smoking status: Never   Smokeless tobacco: Never   Tobacco comments:    Never used  Vaping Use   Vaping status: Never Used  Substance and Sexual Activity   Alcohol use: Yes    Alcohol/week: 1.0 standard drink of alcohol    Types: 1 Glasses of wine per week    Comment: 1 glass of wine once per week   Drug use: No   Sexual activity: Yes    Partners: Male    Birth control/protection: None  Other Topics Concern   Not on file  Social History Narrative   Not on file   Social Drivers of Health   Financial Resource Strain: Low Risk  (05/28/2023)   Overall Financial Resource  Strain (CARDIA)    Difficulty of Paying Living Expenses: Not hard at all  Food Insecurity: No Food Insecurity (05/28/2023)   Hunger Vital Sign    Worried About Running Out of Food in the Last Year: Never true    Ran Out of Food in the Last Year: Never true  Transportation Needs: No Transportation Needs (05/28/2023)   PRAPARE - Administrator, Civil Service (Medical): No    Lack of Transportation (Non-Medical): No  Physical Activity: Inactive (05/28/2023)   Exercise Vital Sign    Days of Exercise per Week: 0 days    Minutes of Exercise per Session: 0 min  Stress: No Stress Concern Present (05/28/2023)   [employer] of Occupational Health - Occupational Stress Questionnaire    Feeling of Stress : Not at all  Social Connections: Moderately Isolated (  05/28/2023)   Social Connection and Isolation Panel [NHANES]    Frequency of Communication with Friends and Family: More than three times a week    Frequency of Social Gatherings with Friends and Family: Once a week    Attends Religious Services: Never    Database administrator or Organizations: No    Attends Engineer, structural: Never    Marital Status: Married    Tobacco Counseling Counseling given: Not Answered Tobacco comments: Never used   Clinical Intake:  Pre-visit preparation completed: Yes  Pain : No/denies pain     BMI - recorded: 31.09 Nutritional Status: BMI > 30  Obese Nutritional Risks: None Diabetes: No  How often do you need to have someone help you when you read instructions, pamphlets, or other written materials from your doctor or pharmacy?: 1 - Never  Interpreter Needed?: No  Information entered by :: Tora Kindred, CMA   Activities of Daily Living    05/28/2023    2:23 PM 05/24/2023    9:58 AM  In your present state of health, do you have any difficulty performing the following activities:  Hearing? 0 0  Vision? 0 0  Difficulty concentrating or making decisions? 0 0  Walking  or climbing stairs? 0 0  Dressing or bathing? 0 0  Doing errands, shopping? 0 0  Preparing Food and eating ? N N  Using the Toilet? N N  In the past six months, have you accidently leaked urine? N N  Do you have problems with loss of bowel control? N N  Managing your Medications? N N  Managing your Finances? N N  Housekeeping or managing your Housekeeping? N N    Patient Care Team: Reubin Milan, MD as PCP - General (Internal Medicine) Hart Robinsons, MD as Referring Physician (Gastroenterology)  Indicate any recent Medical Services you may have received from other than [facility] providers in the past year (date may be approximate).     Assessment:   This is a routine wellness examination for [place].  Hearing/Vision screen Hearing Screening - Comments:: Denies hearing loss Vision Screening - Comments:: Gets eye exams, Dr. Corena Herter in [facility] and [facility]   Goals Addressed               This Visit's Progress     Weight (lb) < 160 lb (72.6 kg) (pt-stated)   170 lb (77.1 kg)     Depression Screen    05/28/2023    2:31 PM 03/04/2023    9:45 AM 05/23/2022    1:57 PM 05/23/2022    1:56 PM 02/26/2022   10:05 AM 05/20/2021    1:41 PM 02/20/2021   10:05 AM  PHQ 2/9 Scores  PHQ - 2 Score 0 0 0 0 0 0 0  PHQ- 9 Score  0   0  0    Fall Risk    05/28/2023    2:36 PM 05/24/2023    9:58 AM 03/04/2023    9:45 AM 05/23/2022    1:57 PM 05/19/2022    9:18 AM  Fall Risk   Falls in the past year? 0 0 0 0 0  Number falls in past yr: 0  0 0   Injury with Fall? 0  0 0   Risk for fall due to : No Fall Risks  No Fall Risks No Fall Risks   Follow up Falls prevention discussed  Falls evaluation completed Falls evaluation completed  MEDICARE RISK AT HOME: Medicare Risk at Home Any stairs in or around the home?: Yes If so, are there any without handrails?: No Home free of loose throw rugs in walkways, pet beds, electrical cords, etc?: Yes Adequate lighting in your home to  reduce risk of falls?: Yes Life alert?: No Use of a cane, walker or w/c?: Yes Grab bars in the bathroom?: Yes Shower chair or bench in shower?: Yes Elevated toilet seat or a handicapped toilet?: No  TIMED UP AND GO:  Was the test performed?  No    Cognitive Function:        05/28/2023    2:36 PM 05/23/2022    1:58 PM 10/06/2018    1:43 PM 09/16/2017    2:06 PM 09/05/2016   10:43 AM  6CIT Screen  What Year? 0 points 0 points 0 points 0 points 0 points  What month? 0 points 0 points 0 points 0 points 0 points  What time? 0 points 0 points 0 points 0 points 0 points  Count back from 20 0 points 0 points 0 points 0 points 0 points  Months in reverse 0 points 0 points 0 points 0 points 0 points  Repeat phrase 0 points 0 points 0 points 0 points 0 points  Total Score 0 points 0 points 0 points 0 points 0 points    Immunizations Immunization History  Administered Date(s) Administered   Fluad Quad(high Dose 65+) 02/16/2019, 02/17/2020, 02/20/2021, 02/26/2022   Fluad Trivalent(High Dose 65+) 03/04/2023   Influenza, High Dose Seasonal PF 02/13/2017, 01/11/2018   Influenza,inj,quad, With Preservative 02/13/2017   Influenza-Unspecified 03/06/2015, 02/06/2016, 02/20/2017   PFIZER Comirnaty(Gray Top)Covid-19 Tri-Sucrose Vaccine 06/17/2019, 07/08/2019   PNEUMOCOCCAL CONJUGATE-20 03/04/2023   Pneumococcal Conjugate-13 02/20/2014   Pneumococcal Polysaccharide-23 04/23/2015   Zoster, Live 08/26/2013    TDAP status: Due, Education has been provided regarding the importance of this vaccine. Advised may receive this vaccine at local pharmacy or Health Dept. Aware to provide a copy of the vaccination record if obtained from local pharmacy or Health Dept. Verbalized acceptance and understanding.  Flu Vaccine status: Up to date  Pneumococcal vaccine status: Up to date  Covid-19 vaccine status: Declined, Education has been provided regarding the importance of this vaccine but patient still  declined. Advised may receive this vaccine at local pharmacy or Health Dept.or vaccine clinic. Aware to provide a copy of the vaccination record if obtained from local pharmacy or Health Dept. Verbalized acceptance and understanding.  Qualifies for Shingles Vaccine? Yes   Zostavax completed No   Shingrix Completed?: Yes  Screening Tests Health Maintenance  Topic Date Due   Zoster Vaccines- Shingrix (1 of 2) 08/14/1964   COVID-19 Vaccine (3 - Pfizer risk series) 08/05/2019   MAMMOGRAM  02/19/2023   DTaP/Tdap/Td (1 - Tdap) 03/03/2024 (Originally 08/14/1964)   Medicare Annual Wellness (AWV)  05/27/2024   Colonoscopy  09/24/2027   Pneumonia Vaccine 67+ Years old  Completed   INFLUENZA VACCINE  Completed   DEXA SCAN  Completed   Hepatitis C Screening  Completed   HPV VACCINES  Aged Out    Health Maintenance  Health Maintenance Due  Topic Date Due   Zoster Vaccines- Shingrix (1 of 2) 08/14/1964   COVID-19 Vaccine (3 - Pfizer risk series) 08/05/2019   MAMMOGRAM  02/19/2023    Colorectal cancer screening: No longer required.   Mammogram status: Ordered 05/28/23. Pt provided with contact info and advised to call to schedule appt.   Bone Density status:  Ordered 05/28/23. Pt provided with contact info and advised to call to schedule appt.  Lung Cancer Screening: (Low Dose CT Chest recommended if Age 17-80 years, 20 pack-year currently smoking OR have quit w/in 15years.) does not qualify.   Lung Cancer Screening Referral: n/a  Additional Screening:  Hepatitis C Screening: does not qualify; Completed 04/23/15  Vision Screening: Recommended annual ophthalmology exams for early detection of glaucoma and other disorders of the eye.  Dental Screening: Recommended annual dental exams for proper oral hygiene  Community Resource Referral / Chronic Care Management: CRR required this visit?  No   CCM required this visit?  No     Plan:     I have personally reviewed and noted the  following in the patient's chart:   Medical and social history Use of alcohol, tobacco or illicit drugs  Current medications and supplements including opioid prescriptions. Patient is not currently taking opioid prescriptions. Functional ability and status Nutritional status Physical activity Advanced directives List of other physicians Hospitalizations, surgeries, and ER visits in previous 12 months Vitals Screenings to include cognitive, depression, and falls Referrals and appointments  In addition, I have reviewed and discussed with patient certain preventive protocols, quality metrics, and best practice recommendations. A written personalized care plan for preventive services as well as general preventive health recommendations were provided to patient.     Tora Kindred, CMA   05/28/2023   After Visit Summary: (MyChart) Due to this being a telephonic visit, the after visit summary with patients personalized plan was offered to patient via MyChart   Nurse Notes:  Needs Tdap and shingles vaccines Placed orders for MMG and DEXA scan. Patient to have done at [facility]. Declined covid vaccine

## 2023-05-28 NOTE — Patient Instructions (Addendum)
Ms. Comolli , Thank you for taking time to come for your Medicare Wellness Visit. I appreciate your ongoing commitment to your health goals. Please review the following plan we discussed and let me know if I can assist you in the future.   Referrals/Orders/Follow-Ups/Clinician Recommendations: Get the tetanus and shingles vaccines at your convenience. I have placed an order for a mammogram and bone density test to be done at [facility]. Call and schedule at your convenience.  This is a list of the screening recommended for you and due dates:  Health Maintenance  Topic Date Due   Zoster (Shingles) Vaccine (1 of 2) 08/14/1964   COVID-19 Vaccine (3 - Pfizer risk series) 08/05/2019   Mammogram  02/19/2023   DTaP/Tdap/Td vaccine (1 - Tdap) 03/03/2024*   Medicare Annual Wellness Visit  05/27/2024   Colon Cancer Screening  09/24/2027   Pneumonia Vaccine  Completed   Flu Shot  Completed   DEXA scan (bone density measurement)  Completed   Hepatitis C Screening  Completed   HPV Vaccine  Aged Out  *Topic was postponed. The date shown is not the original due date.    Advanced directives: (Copy Requested) Please bring a copy of your health care power of attorney and living will to the office to be added to your chart at your convenience.  Next Medicare Annual Wellness Visit scheduled for next year: Yes, 06/09/24 @ 1:50pm (phone visit)

## 2023-06-03 DIAGNOSIS — H353222 Exudative age-related macular degeneration, left eye, with inactive choroidal neovascularization: Secondary | ICD-10-CM | POA: Diagnosis not present

## 2023-06-03 DIAGNOSIS — H353211 Exudative age-related macular degeneration, right eye, with active choroidal neovascularization: Secondary | ICD-10-CM | POA: Diagnosis not present

## 2023-06-03 DIAGNOSIS — H43813 Vitreous degeneration, bilateral: Secondary | ICD-10-CM | POA: Diagnosis not present

## 2023-06-04 DIAGNOSIS — M79675 Pain in left toe(s): Secondary | ICD-10-CM | POA: Diagnosis not present

## 2023-06-04 DIAGNOSIS — M79674 Pain in right toe(s): Secondary | ICD-10-CM | POA: Diagnosis not present

## 2023-06-04 DIAGNOSIS — L600 Ingrowing nail: Secondary | ICD-10-CM | POA: Diagnosis not present

## 2023-06-10 DIAGNOSIS — H524 Presbyopia: Secondary | ICD-10-CM | POA: Diagnosis not present

## 2023-06-17 DIAGNOSIS — L570 Actinic keratosis: Secondary | ICD-10-CM | POA: Diagnosis not present

## 2023-06-17 DIAGNOSIS — L821 Other seborrheic keratosis: Secondary | ICD-10-CM | POA: Diagnosis not present

## 2023-06-17 DIAGNOSIS — L578 Other skin changes due to chronic exposure to nonionizing radiation: Secondary | ICD-10-CM | POA: Diagnosis not present

## 2023-06-17 DIAGNOSIS — L820 Inflamed seborrheic keratosis: Secondary | ICD-10-CM | POA: Diagnosis not present

## 2023-06-17 DIAGNOSIS — B078 Other viral warts: Secondary | ICD-10-CM | POA: Diagnosis not present

## 2023-06-17 DIAGNOSIS — Z85828 Personal history of other malignant neoplasm of skin: Secondary | ICD-10-CM | POA: Diagnosis not present

## 2023-06-17 DIAGNOSIS — D0462 Carcinoma in situ of skin of left upper limb, including shoulder: Secondary | ICD-10-CM | POA: Diagnosis not present

## 2023-06-17 DIAGNOSIS — L812 Freckles: Secondary | ICD-10-CM | POA: Diagnosis not present

## 2023-06-17 DIAGNOSIS — Z1283 Encounter for screening for malignant neoplasm of skin: Secondary | ICD-10-CM | POA: Diagnosis not present

## 2023-06-17 DIAGNOSIS — D485 Neoplasm of uncertain behavior of skin: Secondary | ICD-10-CM | POA: Diagnosis not present

## 2023-06-17 DIAGNOSIS — D225 Melanocytic nevi of trunk: Secondary | ICD-10-CM | POA: Diagnosis not present

## 2023-06-23 DIAGNOSIS — M79675 Pain in left toe(s): Secondary | ICD-10-CM | POA: Diagnosis not present

## 2023-06-23 DIAGNOSIS — L600 Ingrowing nail: Secondary | ICD-10-CM | POA: Diagnosis not present

## 2023-06-23 DIAGNOSIS — M79674 Pain in right toe(s): Secondary | ICD-10-CM | POA: Diagnosis not present

## 2023-07-01 DIAGNOSIS — D0462 Carcinoma in situ of skin of left upper limb, including shoulder: Secondary | ICD-10-CM | POA: Diagnosis not present

## 2023-07-30 LAB — HM MAMMOGRAPHY

## 2023-09-02 ENCOUNTER — Encounter: Payer: Self-pay | Admitting: Internal Medicine

## 2023-09-02 ENCOUNTER — Ambulatory Visit: Payer: Self-pay | Admitting: Internal Medicine

## 2023-09-02 VITALS — BP 126/76 | HR 82 | Ht 62.0 in | Wt 157.2 lb

## 2023-09-02 DIAGNOSIS — M8580 Other specified disorders of bone density and structure, unspecified site: Secondary | ICD-10-CM | POA: Insufficient documentation

## 2023-09-02 DIAGNOSIS — K2100 Gastro-esophageal reflux disease with esophagitis, without bleeding: Secondary | ICD-10-CM | POA: Diagnosis not present

## 2023-09-02 DIAGNOSIS — E782 Mixed hyperlipidemia: Secondary | ICD-10-CM

## 2023-09-02 DIAGNOSIS — I10 Essential (primary) hypertension: Secondary | ICD-10-CM

## 2023-09-02 NOTE — Assessment & Plan Note (Signed)
 Reflux symptoms are controlled on prevacid . Patient denies red flag symptoms - no melena, weight loss, dysphagia.

## 2023-09-02 NOTE — Assessment & Plan Note (Addendum)
 LDL is  Lab Results  Component Value Date   LDLCALC 168 (H) 03/04/2023   Current regimen is Pravachol  but she has not started it yet. She is encouraged to begin treatment. Goal LDL is <130.

## 2023-09-02 NOTE — Progress Notes (Signed)
 Date:  09/02/2023   Name:  Barbara West   DOB:  12-20-1945   MRN:  [id number]   Chief Complaint: Hypertension  Hypertension This is a chronic problem. The problem is controlled. Pertinent negatives include no chest pain, headaches, palpitations or shortness of breath. Past treatments include diuretics. The current treatment provides significant improvement. There is no history of kidney disease, CAD/MI or CVA.  Hyperlipidemia This is a chronic problem. Recent lipid tests were reviewed and are high. Pertinent negatives include no chest pain, myalgias or shortness of breath. Current antihyperlipidemic treatment includes statins. The current treatment provides mild improvement of lipids.  Gastroesophageal Reflux She complains of heartburn. She reports no abdominal pain, no chest pain, no coughing or no wheezing. The problem occurs occasionally. Pertinent negatives include no fatigue. She has tried a PPI for the symptoms. Past procedures include an EGD.    Review of Systems  Constitutional:  Negative for fatigue and unexpected weight change.  HENT:  Negative for trouble swallowing.   Eyes:  Negative for visual disturbance.  Respiratory:  Negative for cough, chest tightness, shortness of breath and wheezing.   Cardiovascular:  Negative for chest pain, palpitations and leg swelling.  Gastrointestinal:  Positive for heartburn. Negative for abdominal pain, constipation and diarrhea.  Musculoskeletal:  Negative for arthralgias and myalgias.  Neurological:  Negative for dizziness, weakness, light-headedness and headaches.     Lab Results  Component Value Date   NA 136 03/04/2023   K 4.0 03/04/2023   CO2 21 03/04/2023   GLUCOSE 85 03/04/2023   BUN 11 03/04/2023   CREATININE 0.90 03/04/2023   CALCIUM  9.9 03/04/2023   EGFR 66 03/04/2023   GFRNONAA 74 02/17/2020   Lab Results  Component Value Date   CHOL 278 (H) 03/04/2023   HDL 67 03/04/2023   LDLCALC 168 (H) 03/04/2023   TRIG 232  (H) 03/04/2023   CHOLHDL 4.1 03/04/2023   Lab Results  Component Value Date   TSH 2.340 03/04/2023   No results found for: "HGBA1C" Lab Results  Component Value Date   WBC 11.2 (H) 03/04/2023   HGB 13.5 03/04/2023   HCT 40.9 03/04/2023   MCV 88 03/04/2023   PLT 457 (H) 03/04/2023   Lab Results  Component Value Date   ALT 11 03/04/2023   AST 18 03/04/2023   ALKPHOS 114 03/04/2023   BILITOT 0.2 03/04/2023   Lab Results  Component Value Date   VD25OH 39.5 02/17/2020     Patient Active Problem List   Diagnosis Date Noted   Osteopenia determined by x-ray 09/02/2023   Benign neoplasm of cerebral meninges (HCC) 04/10/2023   Leucocytosis 03/05/2023   Claustrophobia 05/05/2021   History of total knee arthroplasty 03/15/2021   Degeneration of lumbar intervertebral disc 03/15/2021   Pain in joint of left shoulder 08/10/2020   Lumbar spondylosis 12/22/2018   Esophageal dysmotility 12/19/2016   Disability of walking 12/15/2016   Trochanteric bursitis of both hips 09/05/2016   Atrophic vaginitis 04/23/2015   Avitaminosis D 01/22/2015   Allergic rhinitis, seasonal 01/22/2015   Mixed hyperlipidemia 01/22/2015   Junctional nevus of multiple sites 01/22/2015   Essential (primary) hypertension 01/22/2015   Gastroesophageal reflux disease with esophagitis 01/22/2015    Allergies  Allergen Reactions   Celecoxib Anaphylaxis   Atorvastatin     Clarithromycin Other (See Comments)   Codeine Other (See Comments), Nausea Only and Nausea And Vomiting   Losartan Potassium Other (See Comments)    Pt does not believe  this is correct but will not remove since it was more than 5 years ago   Morphine And Codeine Other (See Comments), Nausea Only and Nausea And Vomiting   Shellfish Allergy Nausea Only   Sulfa Antibiotics Nausea And Vomiting   Lisinopril Rash    Red skin   Mupirocin Rash    Cream/ointment.   Penicillins Rash    Past Surgical History:  Procedure Laterality Date    CATARACT EXTRACTION Bilateral    ESOPHAGOGASTRODUODENOSCOPY  2010   [place]   EYE SURGERY  cateract   HIP SURGERY Right 06/2019   JOINT REPLACEMENT  2009   bilateral   REPLACEMENT TOTAL KNEE BILATERAL Bilateral     Social History   Tobacco Use   Smoking status: Never   Smokeless tobacco: Never   Tobacco comments:    Never used  Vaping Use   Vaping status: Never Used  Substance Use Topics   Alcohol use: Yes    Alcohol/week: 1.0 standard drink of alcohol    Types: 1 Glasses of wine per week    Comment: 1 glass of wine once per week   Drug use: No     Medication list has been reviewed and updated.  Current Meds  Medication Sig   albuterol  (VENTOLIN  HFA) 108 (90 Base) MCG/ACT inhaler Inhale 2 puffs into the lungs every 6 (six) hours as needed for wheezing or shortness of breath.   aspirin EC 81 MG tablet Take 81 mg by mouth daily.   azelastine  (ASTELIN ) 0.1 % nasal spray Place 1 spray into both nostrils 2 (two) times daily.   cholecalciferol (VITAMIN D) 400 units TABS tablet Take 400 Units by mouth daily.   conjugated estrogens  (PREMARIN ) vaginal cream Place 1 Applicatorful vaginally 3 (three) times a week.   fexofenadine (ALLEGRA) 180 MG tablet Take 180 mg by mouth daily.    fluticasone  (FLONASE ) 50 MCG/ACT nasal spray SHAKE LIQUID AND USE 2 SPRAYS IN EACH NOSTRIL TWICE DAILY   gabapentin (NEURONTIN) 100 MG capsule Take 100 mg by mouth at bedtime as needed.   hydrochlorothiazide  (HYDRODIURIL ) 25 MG tablet Take 1 tablet (25 mg total) by mouth daily.   Ibuprofen 200 MG CAPS Take 1 capsule by mouth as needed.    lansoprazole  (PREVACID ) 30 MG capsule Take 1 capsule (30 mg total) by mouth daily at 12 noon.   lidocaine (LIDODERM) 5 % Place 1 patch onto the skin daily as needed.   meclizine (ANTIVERT) 25 MG tablet Take 25 mg by mouth 3 (three) times daily as needed for dizziness.   meloxicam (MOBIC) 7.5 MG tablet Take 7.5 mg by mouth daily as needed.   multivitamin-lutein  (OCUVITE-LUTEIN) CAPS capsule Take 1 capsule by mouth daily.   olopatadine  (PATANOL) 0.1 % ophthalmic solution INSTILL 1 DROP IN BOTH EYES TWICE DAILY   pravastatin  (PRAVACHOL ) 10 MG tablet Take 1 tablet (10 mg total) by mouth daily.       09/02/2023   10:43 AM 03/04/2023    9:45 AM 02/26/2022   10:05 AM 02/20/2021   10:05 AM  GAD 7 : Generalized Anxiety Score  Nervous, Anxious, on Edge 0 0 0 0  Control/stop worrying 0 0 0 0  Worry too much - different things 0 0 0 0  Trouble relaxing 0 0 0 0  Restless 0 0 0 0  Easily annoyed or irritable 0 0 0 0  Afraid - awful might happen 0 0 0 0  Total GAD 7 Score 0 0 0 0  Anxiety Difficulty Not difficult at all Not difficult at all Not difficult at all Not difficult at all       09/02/2023   10:43 AM 05/28/2023    2:31 PM 03/04/2023    9:45 AM  Depression screen PHQ 2/9  Decreased Interest 0 0 0  Down, Depressed, Hopeless 0 0 0  PHQ - 2 Score 0 0 0  Altered sleeping 0  0  Tired, decreased energy 0  0  Change in appetite 0  0  Feeling bad or failure about yourself  0  0  Trouble concentrating 0  0  Moving slowly or fidgety/restless 0  0  Suicidal thoughts 0  0  PHQ-9 Score 0  0  Difficult doing work/chores Not difficult at all  Not difficult at all    BP Readings from Last 3 Encounters:  09/02/23 126/76  03/04/23 126/76  08/28/22 128/72    Physical Exam Vitals and nursing note reviewed.  Constitutional:      General: She is not in acute distress.    Appearance: She is well-developed.  HENT:     Head: Normocephalic and atraumatic.  Neck:     Vascular: No carotid bruit.  Cardiovascular:     Rate and Rhythm: Normal rate and regular rhythm.     Heart sounds: No murmur heard. Pulmonary:     Effort: Pulmonary effort is normal. No respiratory distress.     Breath sounds: No wheezing or rhonchi.  Musculoskeletal:     Cervical back: Normal range of motion.     Right lower leg: No edema.     Left lower leg: No edema.   Lymphadenopathy:     Cervical: No cervical adenopathy.  Skin:    General: Skin is warm and dry.     Capillary Refill: Capillary refill takes less than 2 seconds.     Findings: No rash.  Neurological:     Mental Status: She is alert and oriented to person, place, and time.  Psychiatric:        Mood and Affect: Mood normal.        Behavior: Behavior normal.     Wt Readings from Last 3 Encounters:  09/02/23 157 lb 4 oz (71.3 kg)  05/28/23 170 lb (77.1 kg)  03/04/23 186 lb (84.4 kg)    BP 126/76   Pulse 82   Ht 5\' 2"  (1.575 m)   Wt 157 lb 4 oz (71.3 kg)   SpO2 96%   BMI 28.76 kg/m   Assessment and Plan:  Problem List Items Addressed This Visit       Unprioritized   Mixed hyperlipidemia (Chronic)   LDL is  Lab Results  Component Value Date   LDLCALC 168 (H) 03/04/2023   Current regimen is Pravachol  but she has not started it yet. She is encouraged to begin treatment. Goal LDL is <130.       Essential (primary) hypertension - Primary (Chronic)   Blood pressure is well controlled.  Current medications are hydrochlorothiazide . Will continue same regimen along with efforts to limit dietary sodium.       Gastroesophageal reflux disease with esophagitis (Chronic)   Reflux symptoms are controlled on prevacid . Patient denies red flag symptoms - no melena, weight loss, dysphagia.       Osteopenia determined by x-ray (Chronic)   Due for repeat DEXA Order for DDI given.       No follow-ups on file.    Sheron Dixons, MD   [facility] [place]

## 2023-09-02 NOTE — Assessment & Plan Note (Signed)
 Blood pressure is well controlled.  Current medications are hydrochlorothiazide . Will continue same regimen along with efforts to limit dietary sodium.

## 2023-09-02 NOTE — Assessment & Plan Note (Signed)
Due for repeat DEXA Order for DDI given

## 2023-11-07 ENCOUNTER — Other Ambulatory Visit: Payer: Self-pay | Admitting: Internal Medicine

## 2023-11-07 DIAGNOSIS — I10 Essential (primary) hypertension: Secondary | ICD-10-CM

## 2023-11-10 ENCOUNTER — Other Ambulatory Visit: Payer: Self-pay | Admitting: Internal Medicine

## 2023-11-10 DIAGNOSIS — K224 Dyskinesia of esophagus: Secondary | ICD-10-CM

## 2023-11-10 DIAGNOSIS — K2100 Gastro-esophageal reflux disease with esophagitis, without bleeding: Secondary | ICD-10-CM

## 2023-11-10 NOTE — Telephone Encounter (Signed)
 Requested medication (s) are due for refill today: yes  Requested medication (s) are on the active medication list: yes  Last refill:  05/13/23 #90 1 RF  Future visit scheduled: yes  Notes to clinic:  overdue lab work   Requested Prescriptions  Pending Prescriptions Disp Refills   hydrochlorothiazide  (HYDRODIURIL ) 25 MG tablet [Pharmacy Med Name: HYDROCHLOROTHIAZIDE  25MG TABLETS] 90 tablet 1    Sig: TAKE 1 TABLET(25 MG) BY MOUTH DAILY     Cardiovascular: Diuretics - Thiazide Failed - 11/10/2023  2:16 PM      Failed - Cr in normal range and within 180 days    Creatinine, Ser  Date Value Ref Range Status  03/04/2023 0.90 0.57 - 1.00 mg/dL Final         Failed - K in normal range and within 180 days    Potassium  Date Value Ref Range Status  03/04/2023 4.0 3.5 - 5.2 mmol/L Final         Failed - Na in normal range and within 180 days    Sodium  Date Value Ref Range Status  03/04/2023 136 134 - 144 mmol/L Final         Passed - Last BP in normal range    BP Readings from Last 1 Encounters:  09/02/23 126/76         Passed - Valid encounter within last 6 months    Recent Outpatient Visits           2 months ago Essential (primary) hypertension   [place] [facility] at [facility], Leita DEL, MD       Future Appointments             In 3 months Justus Leita DEL, MD [facility] at [facility], [facility]

## 2023-11-12 NOTE — Telephone Encounter (Signed)
 Requested by interface surescripts. Future visit in 3  months.  Requested Prescriptions  Pending Prescriptions Disp Refills   lansoprazole  (PREVACID ) 30 MG capsule [Pharmacy Med Name: LANSOPRAZOLE  30MG  DR CAPS] 90 capsule 0    Sig: TAKE 1 CAPSULE BY MOUTH DAILY AT NOON     Gastroenterology: Proton Pump Inhibitors 2 Passed - 11/12/2023 11:20 AM      Passed - ALT in normal range and within 360 days    ALT  Date Value Ref Range Status  03/04/2023 11 0 - 32 IU/L Final         Passed - AST in normal range and within 360 days    AST  Date Value Ref Range Status  03/04/2023 18 0 - 40 IU/L Final         Passed - Valid encounter within last 12 months    Recent Outpatient Visits           2 months ago Essential (primary) hypertension   [place] [facility] at [facility], Leita DEL, MD       Future Appointments             In 3 months Justus, Leita DEL, MD [facility] at [facility], [facility]

## 2023-12-14 DIAGNOSIS — I10 Essential (primary) hypertension: Secondary | ICD-10-CM | POA: Diagnosis not present

## 2023-12-14 DIAGNOSIS — Z79899 Other long term (current) drug therapy: Secondary | ICD-10-CM | POA: Diagnosis not present

## 2023-12-14 DIAGNOSIS — R1013 Epigastric pain: Secondary | ICD-10-CM | POA: Diagnosis not present

## 2023-12-14 DIAGNOSIS — K219 Gastro-esophageal reflux disease without esophagitis: Secondary | ICD-10-CM | POA: Diagnosis not present

## 2023-12-14 DIAGNOSIS — K449 Diaphragmatic hernia without obstruction or gangrene: Secondary | ICD-10-CM | POA: Diagnosis not present

## 2023-12-14 DIAGNOSIS — K2289 Other specified disease of esophagus: Secondary | ICD-10-CM | POA: Diagnosis not present

## 2023-12-14 DIAGNOSIS — K222 Esophageal obstruction: Secondary | ICD-10-CM | POA: Diagnosis not present

## 2023-12-14 DIAGNOSIS — K317 Polyp of stomach and duodenum: Secondary | ICD-10-CM | POA: Diagnosis not present

## 2023-12-14 DIAGNOSIS — Z6830 Body mass index (BMI) 30.0-30.9, adult: Secondary | ICD-10-CM | POA: Diagnosis not present

## 2023-12-14 DIAGNOSIS — E66811 Obesity, class 1: Secondary | ICD-10-CM | POA: Diagnosis not present

## 2023-12-14 DIAGNOSIS — R1319 Other dysphagia: Secondary | ICD-10-CM | POA: Diagnosis not present

## 2023-12-16 DIAGNOSIS — C44619 Basal cell carcinoma of skin of left upper limb, including shoulder: Secondary | ICD-10-CM | POA: Diagnosis not present

## 2023-12-16 DIAGNOSIS — D225 Melanocytic nevi of trunk: Secondary | ICD-10-CM | POA: Diagnosis not present

## 2023-12-16 DIAGNOSIS — L578 Other skin changes due to chronic exposure to nonionizing radiation: Secondary | ICD-10-CM | POA: Diagnosis not present

## 2023-12-16 DIAGNOSIS — D485 Neoplasm of uncertain behavior of skin: Secondary | ICD-10-CM | POA: Diagnosis not present

## 2023-12-16 DIAGNOSIS — L570 Actinic keratosis: Secondary | ICD-10-CM | POA: Diagnosis not present

## 2023-12-16 DIAGNOSIS — Z85828 Personal history of other malignant neoplasm of skin: Secondary | ICD-10-CM | POA: Diagnosis not present

## 2023-12-16 DIAGNOSIS — L821 Other seborrheic keratosis: Secondary | ICD-10-CM | POA: Diagnosis not present

## 2023-12-16 DIAGNOSIS — L812 Freckles: Secondary | ICD-10-CM | POA: Diagnosis not present

## 2023-12-16 DIAGNOSIS — Z1283 Encounter for screening for malignant neoplasm of skin: Secondary | ICD-10-CM | POA: Diagnosis not present

## 2023-12-16 DIAGNOSIS — L820 Inflamed seborrheic keratosis: Secondary | ICD-10-CM | POA: Diagnosis not present

## 2023-12-23 DIAGNOSIS — H353211 Exudative age-related macular degeneration, right eye, with active choroidal neovascularization: Secondary | ICD-10-CM | POA: Diagnosis not present

## 2023-12-23 DIAGNOSIS — H353222 Exudative age-related macular degeneration, left eye, with inactive choroidal neovascularization: Secondary | ICD-10-CM | POA: Diagnosis not present

## 2023-12-30 DIAGNOSIS — R1319 Other dysphagia: Secondary | ICD-10-CM | POA: Diagnosis not present

## 2023-12-30 DIAGNOSIS — R12 Heartburn: Secondary | ICD-10-CM | POA: Diagnosis not present

## 2024-01-18 DIAGNOSIS — C44619 Basal cell carcinoma of skin of left upper limb, including shoulder: Secondary | ICD-10-CM | POA: Diagnosis not present

## 2024-02-10 DIAGNOSIS — M1812 Unilateral primary osteoarthritis of first carpometacarpal joint, left hand: Secondary | ICD-10-CM | POA: Diagnosis not present

## 2024-02-24 DIAGNOSIS — H353211 Exudative age-related macular degeneration, right eye, with active choroidal neovascularization: Secondary | ICD-10-CM | POA: Diagnosis not present

## 2024-02-24 DIAGNOSIS — H353222 Exudative age-related macular degeneration, left eye, with inactive choroidal neovascularization: Secondary | ICD-10-CM | POA: Diagnosis not present

## 2024-03-07 ENCOUNTER — Encounter: Payer: Self-pay | Admitting: Internal Medicine

## 2024-03-07 ENCOUNTER — Ambulatory Visit (INDEPENDENT_AMBULATORY_CARE_PROVIDER_SITE_OTHER): Payer: Self-pay | Admitting: Internal Medicine

## 2024-03-07 VITALS — BP 118/76 | HR 94 | Ht 62.0 in | Wt 185.4 lb

## 2024-03-07 DIAGNOSIS — J302 Other seasonal allergic rhinitis: Secondary | ICD-10-CM | POA: Diagnosis not present

## 2024-03-07 DIAGNOSIS — I10 Essential (primary) hypertension: Secondary | ICD-10-CM

## 2024-03-07 DIAGNOSIS — K2100 Gastro-esophageal reflux disease with esophagitis, without bleeding: Secondary | ICD-10-CM

## 2024-03-07 DIAGNOSIS — Z13820 Encounter for screening for osteoporosis: Secondary | ICD-10-CM | POA: Diagnosis not present

## 2024-03-07 DIAGNOSIS — E782 Mixed hyperlipidemia: Secondary | ICD-10-CM

## 2024-03-07 DIAGNOSIS — Z0000 Encounter for general adult medical examination without abnormal findings: Secondary | ICD-10-CM | POA: Diagnosis not present

## 2024-03-07 DIAGNOSIS — Z1231 Encounter for screening mammogram for malignant neoplasm of breast: Secondary | ICD-10-CM | POA: Diagnosis not present

## 2024-03-07 DIAGNOSIS — H353222 Exudative age-related macular degeneration, left eye, with inactive choroidal neovascularization: Secondary | ICD-10-CM | POA: Insufficient documentation

## 2024-03-07 DIAGNOSIS — K224 Dyskinesia of esophagus: Secondary | ICD-10-CM | POA: Diagnosis not present

## 2024-03-07 MED ORDER — AZELASTINE HCL 0.1 % NA SOLN: 1.0000 | Freq: Two times a day (BID) | NASAL | 3 refills | Status: AC

## 2024-03-07 MED ORDER — HYDROCHLOROTHIAZIDE 25 MG PO TABS: 25.0000 mg | ORAL_TABLET | Freq: Every day | ORAL | 1 refills | Status: DC

## 2024-03-07 MED ORDER — FLUTICASONE PROPIONATE 50 MCG/ACT NA SUSP: 2.0000 | Freq: Every day | NASAL | 3 refills | Status: AC

## 2024-03-07 MED ORDER — PRAVASTATIN SODIUM 10 MG PO TABS: 10.0000 mg | ORAL_TABLET | Freq: Every day | ORAL | 1 refills | Status: AC

## 2024-03-07 MED ORDER — LANSOPRAZOLE 30 MG PO CPDR: 30.0000 mg | DELAYED_RELEASE_CAPSULE | Freq: Every day | ORAL | 1 refills | Status: AC

## 2024-03-07 NOTE — Assessment & Plan Note (Signed)
 LDL is  Lab Results  Component Value Date   LDLCALC 168 (H) 03/04/2023    Current medication regimen is pravachol . Goal LDL is < 100.

## 2024-03-07 NOTE — Assessment & Plan Note (Signed)
 Followed by Ophthlamology

## 2024-03-07 NOTE — Assessment & Plan Note (Signed)
 Well controlled blood pressure today. Current regimen is hctz. No medication side effects noted.

## 2024-03-07 NOTE — Progress Notes (Signed)
 Date:  03/07/2024   Name:  Barbara West   DOB:  Apr 07, 1946   MRN:  [id number]   Chief Complaint: Annual Exam Barbara West is a 78 y.o. female who presents today for her Complete Annual Exam. She feels well. She reports exercising- none. She reports she is sleeping well. Breast complaints - none.  Health Maintenance  Topic Date Due   DTaP/Tdap/Td vaccine (1 - Tdap) Never done   Zoster (Shingles) Vaccine (1 of 2) 08/14/1964   COVID-19 Vaccine (3 - Pfizer risk series) 08/05/2019   Flu Shot  08/02/2024*   Medicare Annual Wellness Visit  05/27/2024   Breast Cancer Screening  07/29/2024   Colon Cancer Screening  09/24/2027   Pneumococcal Vaccine for age over 51  Completed   DEXA scan (bone density measurement)  Completed   Hepatitis C Screening  Completed   Meningitis B Vaccine  Aged Out  *Topic was postponed. The date shown is not the original due date.    Hypertension This is a chronic problem. The problem is controlled. Pertinent negatives include no chest pain, headaches, palpitations or shortness of breath. Past treatments include diuretics. The current treatment provides significant improvement. There is no history of kidney disease, CAD/MI or CVA.  Gastroesophageal Reflux She complains of dysphagia and heartburn. She reports no abdominal pain, no chest pain, no coughing or no wheezing. This is a recurrent problem. Pertinent negatives include no fatigue. She has tried a PPI for the symptoms. Past procedures include an EGD.  Hyperlipidemia This is a chronic problem. The problem is controlled. Pertinent negatives include no chest pain, myalgias or shortness of breath. Current antihyperlipidemic treatment includes statins. The current treatment provides significant improvement of lipids.    Review of Systems  Constitutional:  Negative for fatigue and unexpected weight change.  HENT:  Negative for trouble swallowing.   Eyes:  Negative for visual disturbance.  Respiratory:   Negative for cough, chest tightness, shortness of breath and wheezing.   Cardiovascular:  Negative for chest pain, palpitations and leg swelling.  Gastrointestinal:  Positive for dysphagia and heartburn. Negative for abdominal pain, constipation and diarrhea.  Musculoskeletal:  Negative for arthralgias and myalgias.  Neurological:  Negative for dizziness, weakness, light-headedness and headaches.     Lab Results  Component Value Date   NA 136 03/04/2023   K 4.0 03/04/2023   CO2 21 03/04/2023   GLUCOSE 85 03/04/2023   BUN 11 03/04/2023   CREATININE 0.90 03/04/2023   CALCIUM  9.9 03/04/2023   EGFR 66 03/04/2023   GFRNONAA 74 02/17/2020   Lab Results  Component Value Date   CHOL 278 (H) 03/04/2023   HDL 67 03/04/2023   LDLCALC 168 (H) 03/04/2023   TRIG 232 (H) 03/04/2023   CHOLHDL 4.1 03/04/2023   Lab Results  Component Value Date   TSH 2.340 03/04/2023   No results found for: HGBA1C Lab Results  Component Value Date   WBC 11.2 (H) 03/04/2023   HGB 13.5 03/04/2023   HCT 40.9 03/04/2023   MCV 88 03/04/2023   PLT 457 (H) 03/04/2023   Lab Results  Component Value Date   ALT 11 03/04/2023   AST 18 03/04/2023   ALKPHOS 114 03/04/2023   BILITOT 0.2 03/04/2023   Lab Results  Component Value Date   VD25OH 39.5 02/17/2020     Patient Active Problem List   Diagnosis Date Noted   Exudative age-related macular degeneration, left eye, with inactive choroidal neovascularization (HCC) 03/07/2024  Osteopenia determined by x-ray 09/02/2023   Leucocytosis 03/05/2023   Claustrophobia 05/05/2021   History of total knee arthroplasty 03/15/2021   Degeneration of lumbar intervertebral disc 03/15/2021   Pain in joint of left shoulder 08/10/2020   Lumbar spondylosis 12/22/2018   Esophageal dysmotility 12/19/2016   Disability of walking 12/15/2016   Trochanteric bursitis of both hips 09/05/2016   Atrophic vaginitis 04/23/2015   Avitaminosis D 01/22/2015   Allergic rhinitis,  seasonal 01/22/2015   Mixed hyperlipidemia 01/22/2015   Junctional nevus of multiple sites 01/22/2015   Essential (primary) hypertension 01/22/2015   Gastroesophageal reflux disease with esophagitis 01/22/2015    Allergies  Allergen Reactions   Celecoxib Anaphylaxis   Atorvastatin     Clarithromycin Other (See Comments)   Codeine Other (See Comments), Nausea Only and Nausea And Vomiting   Losartan Potassium Other (See Comments)    Pt does not believe this is correct but will not remove since it was more than 5 years ago   Morphine And Codeine Other (See Comments), Nausea Only and Nausea And Vomiting   Shellfish Allergy Nausea Only   Sulfa Antibiotics Nausea And Vomiting   Lisinopril Rash    Red skin   Mupirocin Rash    Cream/ointment.   Penicillins Rash    Past Surgical History:  Procedure Laterality Date   CATARACT EXTRACTION Bilateral    ESOPHAGOGASTRODUODENOSCOPY  2010   [facility]   EYE SURGERY  cateract   HIP SURGERY Right 06/2019   JOINT REPLACEMENT  2009   bilateral   REPLACEMENT TOTAL KNEE BILATERAL Bilateral     Social History   Tobacco Use   Smoking status: Never   Smokeless tobacco: Never   Tobacco comments:    Never used  Vaping Use   Vaping status: Never Used  Substance Use Topics   Alcohol use: Not Currently    Alcohol/week: 1.0 standard drink of alcohol    Types: 1 Glasses of wine per week    Comment: rarely   Drug use: No     Medication list has been reviewed and updated.  Current Meds  Medication Sig   albuterol  (VENTOLIN  HFA) 108 (90 Base) MCG/ACT inhaler Inhale 2 puffs into the lungs every 6 (six) hours as needed for wheezing or shortness of breath.   aspirin EC 81 MG tablet Take 81 mg by mouth daily.   cholecalciferol (VITAMIN D) 400 units TABS tablet Take 400 Units by mouth daily.   conjugated estrogens  (PREMARIN ) vaginal cream Place 1 Applicatorful vaginally 3 (three) times a week.   diclofenac (FLECTOR) 1.3 % PTCH Place 1 patch onto  the skin 2 (two) times daily.   fexofenadine (ALLEGRA) 180 MG tablet Take 180 mg by mouth daily.    Ibuprofen 200 MG CAPS Take 1 capsule by mouth as needed.    lidocaine (LIDODERM) 5 % Place 1 patch onto the skin daily as needed.   meclizine (ANTIVERT) 25 MG tablet Take 25 mg by mouth 3 (three) times daily as needed for dizziness.   multivitamin-lutein (OCUVITE-LUTEIN) CAPS capsule Take 1 capsule by mouth daily.   olopatadine  (PATANOL) 0.1 % ophthalmic solution INSTILL 1 DROP IN BOTH EYES TWICE DAILY   [DISCONTINUED] azelastine  (ASTELIN ) 0.1 % nasal spray Place 1 spray into both nostrils 2 (two) times daily.   [DISCONTINUED] fluticasone  (FLONASE ) 50 MCG/ACT nasal spray SHAKE LIQUID AND USE 2 SPRAYS IN EACH NOSTRIL TWICE DAILY   [DISCONTINUED] hydrochlorothiazide  (HYDRODIURIL ) 25 MG tablet TAKE 1 TABLET(25 MG) BY MOUTH DAILY   [DISCONTINUED]  lansoprazole  (PREVACID ) 30 MG capsule TAKE 1 CAPSULE BY MOUTH DAILY AT NOON   [DISCONTINUED] pravastatin  (PRAVACHOL ) 10 MG tablet Take 1 tablet (10 mg total) by mouth daily.       03/07/2024    9:56 AM 09/02/2023   10:43 AM 03/04/2023    9:45 AM 02/26/2022   10:05 AM  GAD 7 : Generalized Anxiety Score  Nervous, Anxious, on Edge 1 0 0 0  Control/stop worrying 1 0 0 0  Worry too much - different things 1 0 0 0  Trouble relaxing 0 0 0 0  Restless 0 0 0 0  Easily annoyed or irritable 0 0 0 0  Afraid - awful might happen 0 0 0 0  Total GAD 7 Score 3 0 0 0  Anxiety Difficulty Not difficult at all Not difficult at all Not difficult at all Not difficult at all       03/07/2024    9:55 AM 09/02/2023   10:43 AM 05/28/2023    2:31 PM  Depression screen PHQ 2/9  Decreased Interest 0 0 0  Down, Depressed, Hopeless 0 0 0  PHQ - 2 Score 0 0 0  Altered sleeping 0 0   Tired, decreased energy 0 0   Change in appetite 0 0   Feeling bad or failure about yourself  0 0   Trouble concentrating 0 0   Moving slowly or fidgety/restless 0 0   Suicidal thoughts 0 0    PHQ-9 Score 0 0   Difficult doing work/chores Not difficult at all Not difficult at all     BP Readings from Last 3 Encounters:  03/07/24 118/76  09/02/23 126/76  03/04/23 126/76    Physical Exam Vitals and nursing note reviewed.  Constitutional:      General: She is not in acute distress.    Appearance: She is well-developed.  HENT:     Head: Normocephalic and atraumatic.     Right Ear: Tympanic membrane and ear canal normal.     Left Ear: Tympanic membrane and ear canal normal.     Nose:     Right Sinus: No maxillary sinus tenderness.     Left Sinus: No maxillary sinus tenderness.  Eyes:     General: No scleral icterus.       Right eye: No discharge.        Left eye: No discharge.     Conjunctiva/sclera: Conjunctivae normal.  Neck:     Thyroid : No thyromegaly.     Vascular: No carotid bruit.  Cardiovascular:     Rate and Rhythm: Normal rate and regular rhythm.     Pulses: Normal pulses.     Heart sounds: Normal heart sounds.  Pulmonary:     Effort: Pulmonary effort is normal. No respiratory distress.     Breath sounds: No wheezing.  Abdominal:     General: Bowel sounds are normal.     Palpations: Abdomen is soft.     Tenderness: There is no abdominal tenderness.  Musculoskeletal:     Cervical back: Normal range of motion. No erythema.     Right lower leg: No edema.     Left lower leg: No edema.  Lymphadenopathy:     Cervical: No cervical adenopathy.  Skin:    General: Skin is warm and dry.     Findings: No rash.  Neurological:     Mental Status: She is alert and oriented to person, place, and time.     Cranial Nerves:  No cranial nerve deficit.     Sensory: No sensory deficit.     Deep Tendon Reflexes: Reflexes are normal and symmetric.  Psychiatric:        Attention and Perception: Attention normal.        Mood and Affect: Mood normal.     Wt Readings from Last 3 Encounters:  03/07/24 185 lb 6.4 oz (84.1 kg)  09/02/23 157 lb 4 oz (71.3 kg)  05/28/23  170 lb (77.1 kg)    BP 118/76   Pulse 94   Ht 5' 2 (1.575 m)   Wt 185 lb 6.4 oz (84.1 kg)   SpO2 96%   BMI 33.91 kg/m   Assessment and Plan:  Problem List Items Addressed This Visit       Unprioritized   Allergic rhinitis, seasonal   Relevant Medications   azelastine  (ASTELIN ) 0.1 % nasal spray   fluticasone  (FLONASE ) 50 MCG/ACT nasal spray   Mixed hyperlipidemia (Chronic)   LDL is  Lab Results  Component Value Date   LDLCALC 168 (H) 03/04/2023    Current medication regimen is pravachol . Goal LDL is < 100.       Relevant Medications   hydrochlorothiazide  (HYDRODIURIL ) 25 MG tablet   pravastatin  (PRAVACHOL ) 10 MG tablet   Other Relevant Orders   Lipid panel   Essential (primary) hypertension (Chronic)   Well controlled blood pressure today. Current regimen is hctz. No medication side effects noted.        Relevant Medications   hydrochlorothiazide  (HYDRODIURIL ) 25 MG tablet   pravastatin  (PRAVACHOL ) 10 MG tablet   Other Relevant Orders   CBC with Differential/Platelet   Comprehensive metabolic panel with GFR   TSH   Urinalysis, Routine w reflex microscopic   Gastroesophageal reflux disease with esophagitis (Chronic)   Relevant Medications   lansoprazole  (PREVACID ) 30 MG capsule   Other Relevant Orders   CBC with Differential/Platelet   Esophageal dysmotility   Seen by GI - dilatation done; continues on Prevacid  with PRN Pepcid EGD 12/2023 Findings: A widely patent Schatzki ring was found in the distal  esophagus. Four quadrant biopises were taken with a  cold forceps for histology and to disrupt the ring. A small hiatal hernia was present. A few small fundic gland polyps with no bleeding and  no stigmata of recent bleeding were found in the  stomach. The examined duodenum was normal.\ Pathology - benign       Relevant Medications   lansoprazole  (PREVACID ) 30 MG capsule   Exudative age-related macular degeneration, left eye, with inactive  choroidal neovascularization (HCC)   Followed by Ophthlamology      Other Visit Diagnoses       Annual physical exam    -  Primary     Encounter for screening mammogram for breast cancer       at [facility] - due in March     Encounter for screening for osteoporosis       Rx given for [facility]       Return in about 6 months (around 09/04/2024) for HTN  Dr Lemon.    Leita HILARIO Adie, MD [facility] [place]

## 2024-03-07 NOTE — Assessment & Plan Note (Addendum)
 Seen by GI - dilatation done; continues on Prevacid  with PRN Pepcid EGD 12/2023 Findings: A widely patent Schatzki ring was found in the distal  esophagus. Four quadrant biopises were taken with a  cold forceps for histology and to disrupt the ring. A small hiatal hernia was present. A few small fundic gland polyps with no bleeding and  no stigmata of recent bleeding were found in the  stomach. The examined duodenum was normal.\ Pathology - benign

## 2024-03-08 ENCOUNTER — Ambulatory Visit: Payer: Self-pay | Admitting: Internal Medicine

## 2024-03-08 LAB — CBC WITH DIFFERENTIAL/PLATELET
Basophils Absolute: 0.1 x10E3/uL (ref 0.0–0.2)
Basos: 1 %
EOS (ABSOLUTE): 0.7 x10E3/uL — ABNORMAL HIGH (ref 0.0–0.4)
Eos: 6 %
Hematocrit: 41.7 % (ref 34.0–46.6)
Hemoglobin: 13.7 g/dL (ref 11.1–15.9)
Immature Grans (Abs): 0 x10E3/uL (ref 0.0–0.1)
Immature Granulocytes: 0 %
Lymphocytes Absolute: 3.9 x10E3/uL — ABNORMAL HIGH (ref 0.7–3.1)
Lymphs: 34 %
MCH: 28.5 pg (ref 26.6–33.0)
MCHC: 32.9 g/dL (ref 31.5–35.7)
MCV: 87 fL (ref 79–97)
Monocytes Absolute: 1.4 x10E3/uL — ABNORMAL HIGH (ref 0.1–0.9)
Monocytes: 13 %
Neutrophils Absolute: 5.3 x10E3/uL (ref 1.4–7.0)
Neutrophils: 46 %
Platelets: 404 x10E3/uL (ref 150–450)
RBC: 4.81 x10E6/uL (ref 3.77–5.28)
RDW: 12.6 % (ref 11.7–15.4)
WBC: 11.4 x10E3/uL — ABNORMAL HIGH (ref 3.4–10.8)

## 2024-03-08 LAB — COMPREHENSIVE METABOLIC PANEL WITH GFR
ALT: 10 IU/L (ref 0–32)
AST: 18 IU/L (ref 0–40)
Albumin: 4.1 g/dL (ref 3.8–4.8)
Alkaline Phosphatase: 107 IU/L (ref 49–135)
BUN/Creatinine Ratio: 12 (ref 12–28)
BUN: 12 mg/dL (ref 8–27)
Bilirubin Total: 0.3 mg/dL (ref 0.0–1.2)
CO2: 23 mmol/L (ref 20–29)
Calcium: 9.7 mg/dL (ref 8.7–10.3)
Chloride: 97 mmol/L (ref 96–106)
Creatinine, Ser: 1 mg/dL (ref 0.57–1.00)
Globulin, Total: 2.9 g/dL (ref 1.5–4.5)
Glucose: 83 mg/dL (ref 70–99)
Potassium: 4 mmol/L (ref 3.5–5.2)
Sodium: 135 mmol/L (ref 134–144)
Total Protein: 7 g/dL (ref 6.0–8.5)
eGFR: 58 mL/min/1.73 — ABNORMAL LOW (ref >59)

## 2024-03-08 LAB — URINALYSIS, ROUTINE W REFLEX MICROSCOPIC
Bilirubin, UA: NEGATIVE
Glucose, UA: NEGATIVE
Ketones, UA: NEGATIVE
Nitrite, UA: NEGATIVE
Protein,UA: NEGATIVE
RBC, UA: NEGATIVE
Specific Gravity, UA: 1.015 (ref 1.005–1.030)
Urobilinogen, Ur: 0.2 mg/dL (ref 0.2–1.0)
pH, UA: 7.5 (ref 5.0–7.5)

## 2024-03-08 LAB — MICROSCOPIC EXAMINATION
Bacteria, UA: NONE SEEN
Casts: NONE SEEN /LPF
WBC, UA: NONE SEEN /HPF (ref 0–5)

## 2024-03-08 LAB — LIPID PANEL
Chol/HDL Ratio: 4.4 ratio (ref 0.0–4.4)
Cholesterol, Total: 277 mg/dL — ABNORMAL HIGH (ref 100–199)
HDL: 63 mg/dL (ref >39)
LDL Chol Calc (NIH): 157 mg/dL — ABNORMAL HIGH (ref 0–99)
Triglycerides: 310 mg/dL — ABNORMAL HIGH (ref 0–149)
VLDL Cholesterol Cal: 57 mg/dL — ABNORMAL HIGH (ref 5–40)

## 2024-03-08 LAB — TSH: TSH: 1.91 u[IU]/mL (ref 0.450–4.500)

## 2024-03-25 DIAGNOSIS — M19012 Primary osteoarthritis, left shoulder: Secondary | ICD-10-CM | POA: Diagnosis not present

## 2024-05-06 ENCOUNTER — Other Ambulatory Visit: Payer: Self-pay | Admitting: Internal Medicine

## 2024-05-06 DIAGNOSIS — I10 Essential (primary) hypertension: Secondary | ICD-10-CM

## 2024-05-06 NOTE — Telephone Encounter (Signed)
 Requested by interface surescripts. Future visit 09/14/24. Requested Prescriptions  Pending Prescriptions Disp Refills   hydrochlorothiazide  (HYDRODIURIL ) 25 MG tablet [Pharmacy Med Name: HYDROCHLOROTHIAZIDE  25MG  TABLETS] 90 tablet 1    Sig: TAKE 1 TABLET(25 MG) BY MOUTH DAILY     Cardiovascular: Diuretics - Thiazide Passed - 05/06/2024  3:35 PM      Passed - Cr in normal range and within 180 days    Creatinine, Ser  Date Value Ref Range Status  03/07/2024 1.00 0.57 - 1.00 mg/dL Final         Passed - K in normal range and within 180 days    Potassium  Date Value Ref Range Status  03/07/2024 4.0 3.5 - 5.2 mmol/L Final         Passed - Na in normal range and within 180 days    Sodium  Date Value Ref Range Status  03/07/2024 135 134 - 144 mmol/L Final         Passed - Last BP in normal range    BP Readings from Last 1 Encounters:  03/07/24 118/76         Passed - Valid encounter within last 6 months    Recent Outpatient Visits           2 months ago Annual physical exam   [facility] at [facility], Leita DEL, MD   8 months ago Essential (primary) hypertension   [facility] at [facility], Leita DEL, MD

## 2024-08-11 ENCOUNTER — Ambulatory Visit

## 2024-09-14 ENCOUNTER — Ambulatory Visit: Admitting: Student
